# Patient Record
Sex: Female | Born: 1975 | Hispanic: No | Marital: Married | Smoking: Never smoker
Health system: Southern US, Community
[De-identification: ages and names within clinical notes are randomized; demographics above are authoritative.]

## PROBLEM LIST (undated history)

## (undated) ENCOUNTER — Emergency Department (HOSPITAL_COMMUNITY): Payer: No Typology Code available for payment source

## (undated) DIAGNOSIS — E119 Type 2 diabetes mellitus without complications: Secondary | ICD-10-CM

## (undated) DIAGNOSIS — E785 Hyperlipidemia, unspecified: Secondary | ICD-10-CM

## (undated) DIAGNOSIS — I10 Essential (primary) hypertension: Secondary | ICD-10-CM

## (undated) HISTORY — DX: Essential (primary) hypertension: I10

## (undated) HISTORY — DX: Hyperlipidemia, unspecified: E78.5

---

## 2016-06-07 ENCOUNTER — Emergency Department (HOSPITAL_COMMUNITY)
Admission: EM | Admit: 2016-06-07 | Discharge: 2016-06-07 | Disposition: A | Discharge status: Home / Self Care | Payer: Self-pay | Attending: Emergency Medicine | Admitting: Emergency Medicine

## 2016-06-07 ENCOUNTER — Encounter (HOSPITAL_COMMUNITY): Payer: Self-pay | Admitting: Emergency Medicine

## 2016-06-07 ENCOUNTER — Emergency Department (HOSPITAL_COMMUNITY): Payer: Self-pay

## 2016-06-07 DIAGNOSIS — E119 Type 2 diabetes mellitus without complications: Secondary | ICD-10-CM | POA: Insufficient documentation

## 2016-06-07 DIAGNOSIS — R109 Unspecified abdominal pain: Secondary | ICD-10-CM

## 2016-06-07 DIAGNOSIS — N898 Other specified noninflammatory disorders of vagina: Secondary | ICD-10-CM | POA: Insufficient documentation

## 2016-06-07 DIAGNOSIS — R102 Pelvic and perineal pain: Secondary | ICD-10-CM

## 2016-06-07 HISTORY — DX: Type 2 diabetes mellitus without complications: E11.9

## 2016-06-07 LAB — URINALYSIS, ROUTINE W REFLEX MICROSCOPIC
BACTERIA UA: NONE SEEN
Bilirubin Urine: NEGATIVE
Hgb urine dipstick: NEGATIVE
KETONES UR: NEGATIVE mg/dL
LEUKOCYTES UA: NEGATIVE
Nitrite: NEGATIVE
PROTEIN: NEGATIVE mg/dL
Specific Gravity, Urine: 1.013 (ref 1.005–1.030)
pH: 6 (ref 5.0–8.0)

## 2016-06-07 LAB — COMPREHENSIVE METABOLIC PANEL
ALK PHOS: 62 U/L (ref 38–126)
ALT: 18 U/L (ref 14–54)
AST: 17 U/L (ref 15–41)
Albumin: 4.3 g/dL (ref 3.5–5.0)
Anion gap: 10 (ref 5–15)
BUN: 6 mg/dL (ref 6–20)
CALCIUM: 9.7 mg/dL (ref 8.9–10.3)
CO2: 24 mmol/L (ref 22–32)
CREATININE: 0.54 mg/dL (ref 0.44–1.00)
Chloride: 100 mmol/L — ABNORMAL LOW (ref 101–111)
Glucose, Bld: 256 mg/dL — ABNORMAL HIGH (ref 65–99)
Potassium: 3.9 mmol/L (ref 3.5–5.1)
Sodium: 134 mmol/L — ABNORMAL LOW (ref 135–145)
Total Bilirubin: 0.9 mg/dL (ref 0.3–1.2)
Total Protein: 7.5 g/dL (ref 6.5–8.1)

## 2016-06-07 LAB — CBC WITH DIFFERENTIAL/PLATELET
Basophils Absolute: 0 10*3/uL (ref 0.0–0.1)
Basophils Relative: 0 %
EOS PCT: 0 %
Eosinophils Absolute: 0 10*3/uL (ref 0.0–0.7)
HCT: 43.8 % (ref 36.0–46.0)
Hemoglobin: 14.6 g/dL (ref 12.0–15.0)
Lymphocytes Relative: 16 %
Lymphs Abs: 2 10*3/uL (ref 0.7–4.0)
MCH: 23.4 pg — AB (ref 26.0–34.0)
MCHC: 33.3 g/dL (ref 30.0–36.0)
MCV: 70.3 fL — AB (ref 78.0–100.0)
MONO ABS: 0.2 10*3/uL (ref 0.1–1.0)
Monocytes Relative: 2 %
NEUTROS ABS: 10 10*3/uL — AB (ref 1.7–7.7)
NEUTROS PCT: 82 %
PLATELETS: 392 10*3/uL (ref 150–400)
RBC: 6.23 MIL/uL — ABNORMAL HIGH (ref 3.87–5.11)
RDW: 13.7 % (ref 11.5–15.5)
WBC: 12.2 10*3/uL — ABNORMAL HIGH (ref 4.0–10.5)

## 2016-06-07 LAB — WET PREP, GENITAL
CLUE CELLS WET PREP: NONE SEEN
Sperm: NONE SEEN
TRICH WET PREP: NONE SEEN
YEAST WET PREP: NONE SEEN

## 2016-06-07 LAB — POC URINE PREG, ED: Preg Test, Ur: NEGATIVE

## 2016-06-07 NOTE — ED Notes (Signed)
Pelvic cart at bedside. 

## 2016-06-07 NOTE — ED Notes (Signed)
Patient transported to Ultrasound 

## 2016-06-07 NOTE — ED Notes (Signed)
Patient verbalized understanding of discharge instructions and denies any further needs or questions at this time. VS stable. Patient ambulatory with steady gait.  

## 2016-06-07 NOTE — ED Provider Notes (Signed)
[facility]-EMERGENCY DEPT Provider Note   CSN: [id number] Arrival date & time: 06/07/16  1612     History   Chief Complaint Chief Complaint  Patient presents with  . Vaginal Discharge    HPI Morgan Osborne is a 41 y.o. female.  The history is provided by the patient. The history is limited by a language barrier. Language interpreter used: pt's cousin help translate; pt declined translator services.  Vaginal Discharge   This is a recurrent problem. Episode onset: several years. Episode frequency: intermittent. The problem has not changed since onset.Context: since IUD placement. The discharge was white and yellow. She has not missed her period. Associated symptoms include abdominal pain (suprapubic). Pertinent negatives include no anorexia, no fever, no constipation, no diarrhea, no nausea, no vomiting, no dyspareunia, no dysuria, no frequency, no genital burning, no genital itching, no genital lesions, no perineal pain and no perineal odor. Associated symptoms comments: Intermittent sharp vaginal pain that radiates to back. She has tried nothing for the symptoms. Her past medical history does not include irregular periods, STD or ectopic pregnancy.   She reports that IUP was placed 5 years ago.  Past Medical History:  Diagnosis Date  . Diabetes mellitus without complication (HCC)     There are no active problems to display for this patient.   History reviewed. No pertinent surgical history.  OB History    No data available       Home Medications    Prior to Admission medications   Not on File    Family History History reviewed. No pertinent family history.  Social History Social History  Substance Use Topics  . Smoking status: Never Smoker  . Smokeless tobacco: Never Used  . Alcohol use No     Allergies   Patient has no known allergies.   Review of Systems Review of Systems  Constitutional: Negative for fever.  Gastrointestinal: Positive for abdominal pain  (suprapubic). Negative for anorexia, constipation, diarrhea, nausea and vomiting.  Genitourinary: Positive for vaginal discharge. Negative for dyspareunia, dysuria and frequency.  Ten systems are reviewed and are negative for acute change except as noted in the HPI    Physical Exam Updated Vital Signs BP 126/74   Pulse 75   Temp 98.4 F (36.9 C) (Oral)   Resp 18   SpO2 99%   Physical Exam  Constitutional: She is oriented to person, place, and time. She appears well-developed and well-nourished. No distress.  HENT:  Head: Normocephalic and atraumatic.  Nose: Nose normal.  Eyes: Conjunctivae and EOM are normal. Pupils are equal, round, and reactive to light. Right eye exhibits no discharge. Left eye exhibits no discharge. No scleral icterus.  Neck: Normal range of motion. Neck supple.  Cardiovascular: Normal rate and regular rhythm.  Exam reveals no gallop and no friction rub.   No murmur heard. Pulmonary/Chest: Effort normal and breath sounds normal. No stridor. No respiratory distress. She has no rales.  Abdominal: Soft. She exhibits no distension. There is tenderness (discomfort) in the suprapubic area.  Genitourinary: Pelvic exam was performed with patient supine. There is no rash, tenderness or lesion on the right labia. There is no rash, tenderness or lesion on the left labia. Uterus is tender. Cervix exhibits no motion tenderness, no discharge and no friability. Right adnexum displays tenderness. Right adnexum displays no mass. Left adnexum displays no mass and no tenderness. No erythema in the vagina. No foreign body in the vagina. No vaginal discharge found.  Genitourinary Comments: Chaperone present during  pelvic exam. IUP string in OS   Musculoskeletal: She exhibits no edema or tenderness.  Neurological: She is alert and oriented to person, place, and time.  Skin: Skin is warm and dry. No rash noted. She is not diaphoretic. No erythema.  Psychiatric: She has a normal mood and  affect.  Vitals reviewed.    ED Treatments / Results  Labs (all labs ordered are listed, but only abnormal results are displayed) Labs Reviewed  WET PREP, GENITAL - Abnormal; Notable for the following:       Result Value   WBC, Wet Prep HPF POC MODERATE (*)    All other components within normal limits  URINALYSIS, ROUTINE W REFLEX MICROSCOPIC - Abnormal; Notable for the following:    Color, Urine STRAW (*)    Glucose, UA >=500 (*)    Squamous Epithelial / LPF 0-5 (*)    All other components within normal limits  CBC WITH DIFFERENTIAL/PLATELET - Abnormal; Notable for the following:    WBC 12.2 (*)    RBC 6.23 (*)    MCV 70.3 (*)    MCH 23.4 (*)    Neutro Abs 10.0 (*)    All other components within normal limits  COMPREHENSIVE METABOLIC PANEL - Abnormal; Notable for the following:    Sodium 134 (*)    Chloride 100 (*)    Glucose, Bld 256 (*)    All other components within normal limits  POC URINE PREG, ED  GC/CHLAMYDIA PROBE AMP ([place]) NOT AT [facility]    EKG  EKG Interpretation None       Radiology US Transvaginal Non-ob  Result Date: 06/07/2016 CLINICAL DATA:  Acute onset of pelvic pain and chronic vaginal discharge. Initial encounter. EXAM: TRANSABDOMINAL AND TRANSVAGINAL ULTRASOUND OF PELVIS TECHNIQUE: Both transabdominal and transvaginal ultrasound examinations of the pelvis were performed. Transabdominal technique was performed for global imaging of the pelvis including uterus, ovaries, adnexal regions, and pelvic cul-de-sac. It was necessary to proceed with endovaginal exam following the transabdominal exam to visualize the uterus and ovaries in greater detail. COMPARISON:  None FINDINGS: Uterus Measurements: 9.8 x 5.4 x 7.1 cm. No fibroids or other mass visualized. Endometrium Thickness: 1.0 cm. The patient's intrauterine device is noted in expected position at the fundus of the uterus. Right ovary Measurements: 3.6 x 2.5 x 2.8 cm. Normal appearance/no adnexal  mass. Left ovary Not visualized on this study. Other findings No abnormal free fluid. IMPRESSION: 1. Unremarkable pelvic ultrasound. Intrauterine device noted in expected position. 2. Left ovary not visualized.  No evidence for ovarian torsion. Electronically Signed   By: Roanna Raider M.D.   On: 06/07/2016 23:06   US Pelvis Complete  Result Date: 06/07/2016 CLINICAL DATA:  Acute onset of pelvic pain and chronic vaginal discharge. Initial encounter. EXAM: TRANSABDOMINAL AND TRANSVAGINAL ULTRASOUND OF PELVIS TECHNIQUE: Both transabdominal and transvaginal ultrasound examinations of the pelvis were performed. Transabdominal technique was performed for global imaging of the pelvis including uterus, ovaries, adnexal regions, and pelvic cul-de-sac. It was necessary to proceed with endovaginal exam following the transabdominal exam to visualize the uterus and ovaries in greater detail. COMPARISON:  None FINDINGS: Uterus Measurements: 9.8 x 5.4 x 7.1 cm. No fibroids or other mass visualized. Endometrium Thickness: 1.0 cm. The patient's intrauterine device is noted in expected position at the fundus of the uterus. Right ovary Measurements: 3.6 x 2.5 x 2.8 cm. Normal appearance/no adnexal mass. Left ovary Not visualized on this study. Other findings No abnormal free fluid. IMPRESSION: 1.  Unremarkable pelvic ultrasound. Intrauterine device noted in expected position. 2. Left ovary not visualized.  No evidence for ovarian torsion. Electronically Signed   By: Roanna RaiderJeffery  Chang M.D.   On: 06/07/2016 23:06    Procedures Procedures (including critical care time)  Medications Ordered in ED Medications - No data to display   Initial Impression / Assessment and Plan / ED Course  I have reviewed the triage vital signs and the nursing notes.  Pertinent labs & imaging results that were available during my care of the patient were reviewed by me and considered in my medical decision making (see chart for details).      No evidence of vaginal discharge on exam. Patient had no discomfort in the pelvic region. No CMT on exam. Low suspicion for torsion or TOA. CBC with mild leukocytosis. Other labs grossly reassuring. Wet prep without evidence of Trichomonas, bacterial vaginosis or yeast infection. We'll obtain pelvic ultrasound to assess for IUP location.  Pelvic ultrasound unremarkable.  Low suspicion for appendicitis, colitis, diverticulitis. The patient is safe for discharge with strict return precautions.   Final Clinical Impressions(s) / ED Diagnoses   Final diagnoses:  Vaginal discharge  Abdominal cramping   Disposition: Discharge  Condition: Good  I have discussed the results, Dx and Tx plan with the patient who expressed understanding and agree(s) with the plan. Discharge instructions discussed at great length. The patient was given strict return precautions who verbalized understanding of the instructions. No further questions at time of discharge.    There are no discharge medications for this patient.   Follow Up: primary care provider  Schedule an appointment as soon as possible for a visit    [facility] [address] [place] [id number] [phone] Schedule an appointment as soon as possible for a visit         Nira ConnPedro Eduardo Cardama, MD 06/08/16 [phone]

## 2016-06-07 NOTE — ED Triage Notes (Signed)
Pt sts vaginal discharge and pain; pt sts discharge is yellow in color

## 2016-06-08 LAB — GC/CHLAMYDIA PROBE AMP (~~LOC~~) NOT AT ARMC
Chlamydia: NEGATIVE
Neisseria Gonorrhea: NEGATIVE

## 2017-07-09 IMAGING — US US PELVIS COMPLETE
1 series · 14 of 25 positions shown · non-contrast
Comparison: None

CLINICAL DATA: Acute onset of pelvic pain and chronic vaginal
discharge. Initial encounter.

EXAM:
TRANSABDOMINAL AND TRANSVAGINAL ULTRASOUND OF PELVIS
TECHNIQUE: Both transabdominal and transvaginal ultrasound examinations of the
pelvis were performed. Transabdominal technique was performed for
global imaging of the pelvis including uterus, ovaries, adnexal
regions, and pelvic cul-de-sac. It was necessary to proceed with
endovaginal exam following the transabdominal exam to visualize the
uterus and ovaries in greater detail.

[Series 1: us pelvis complete · 0.22mm/px · 90 acquisitions, 14 frames shown]
[im 1/90]
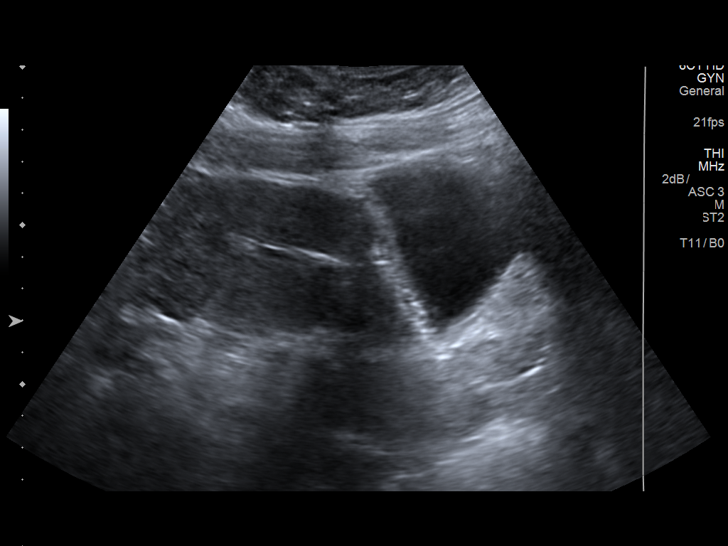
[im 8/90]
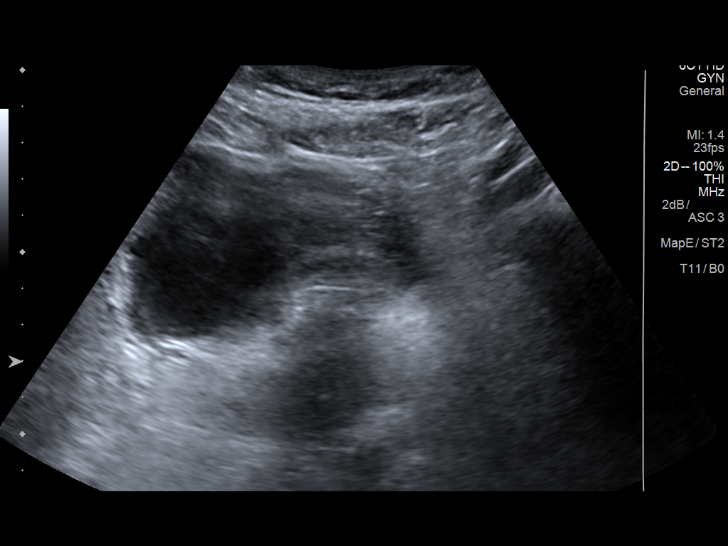
[im 15/90]
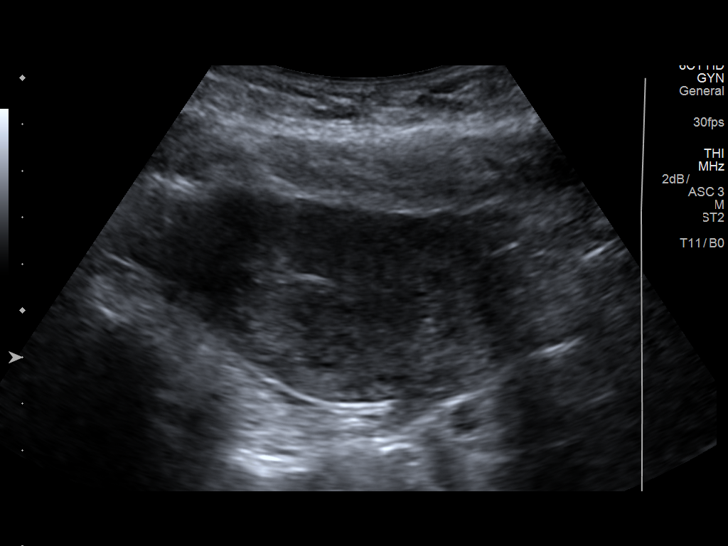
[im 23/90]
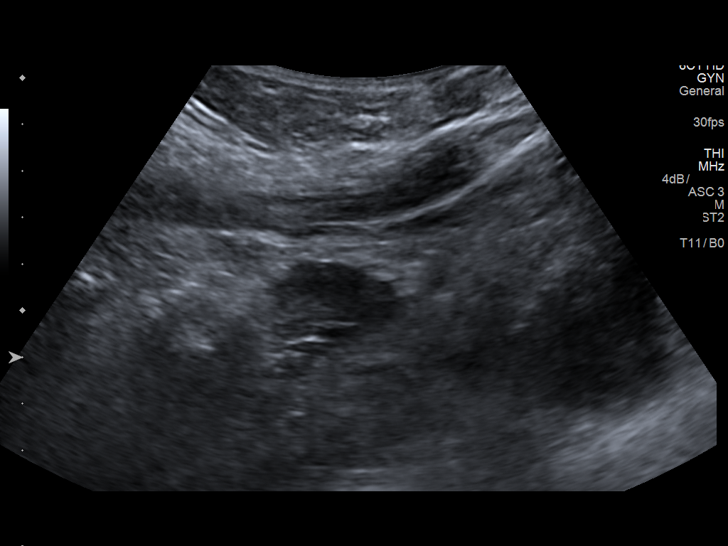
[im 30/90]
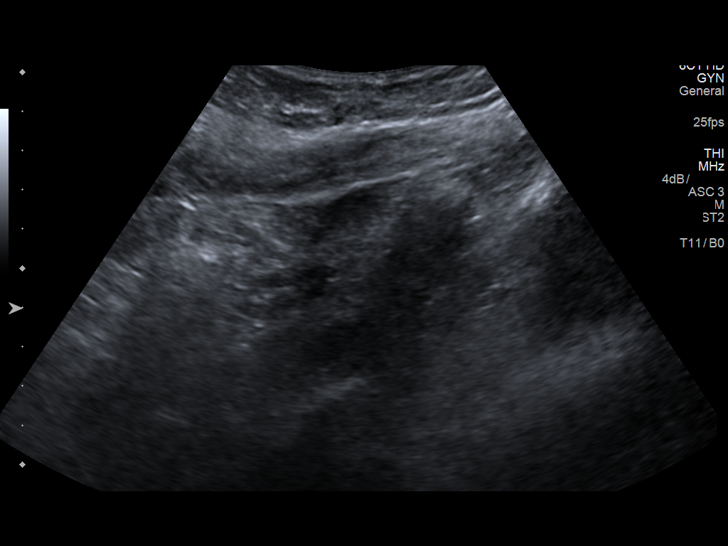
[im 34/90]
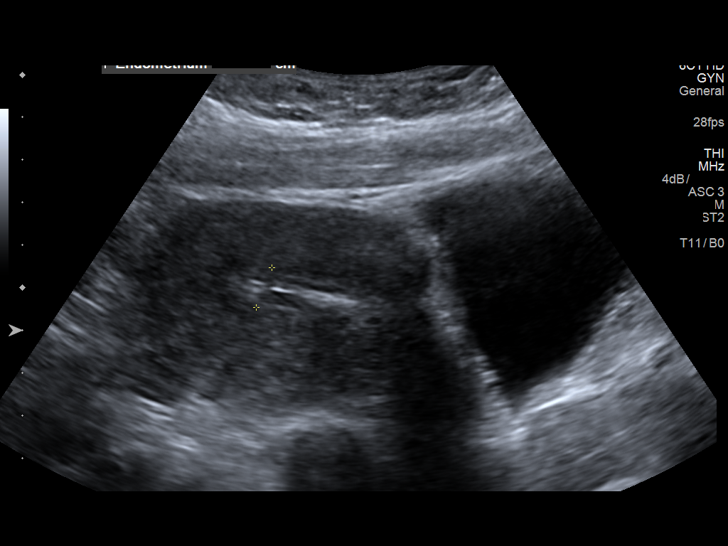
[im 41/90]
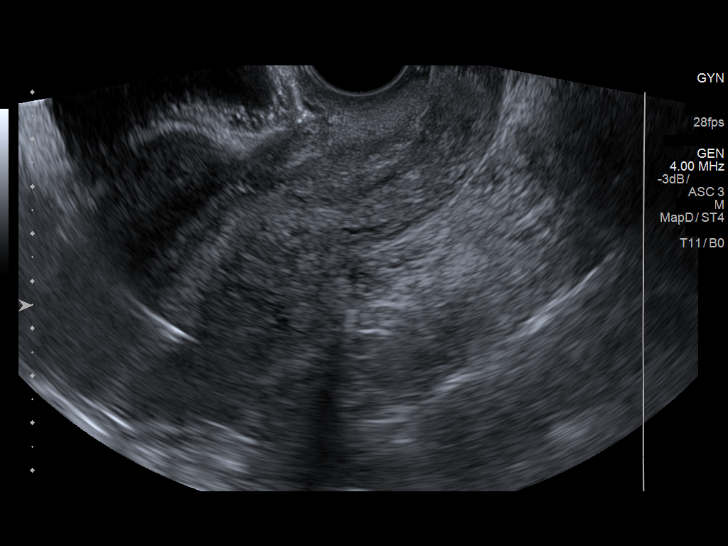
[im 49/90]
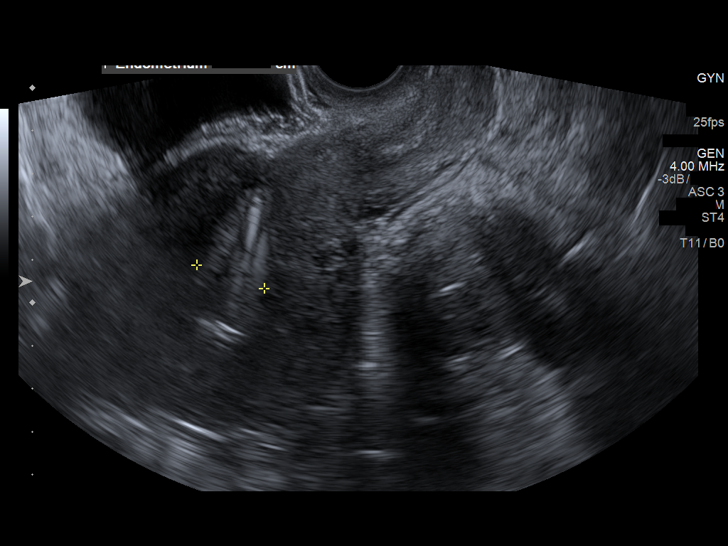
[im 56/90]
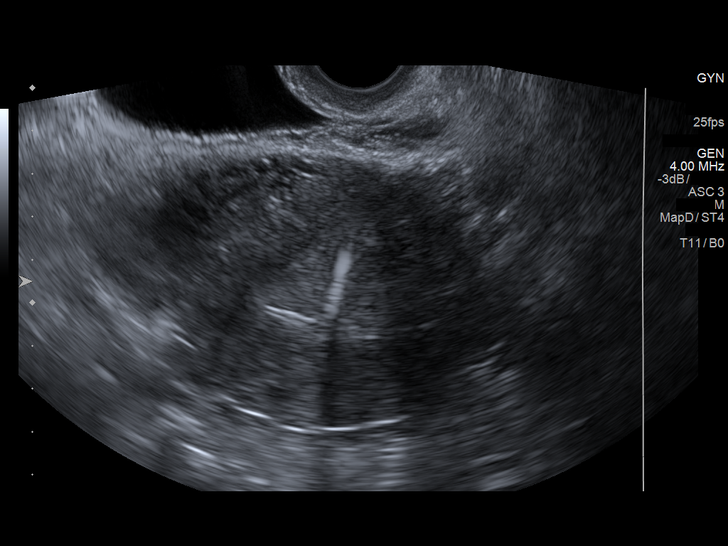
[im 60/90]
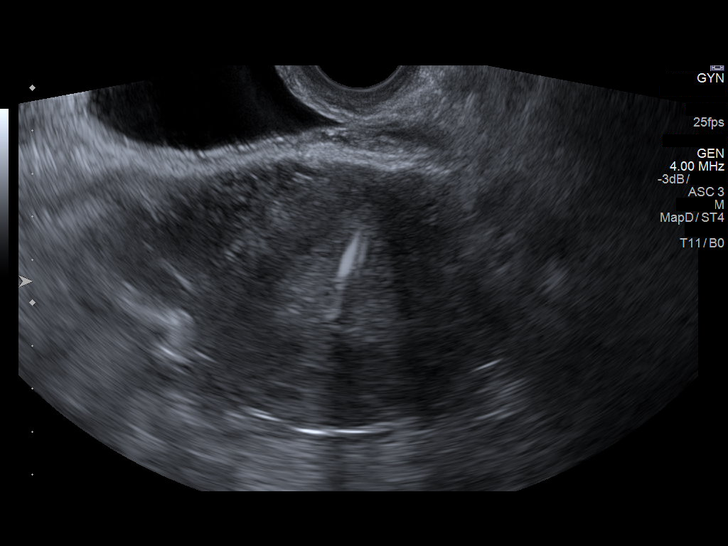
[im 67/90]
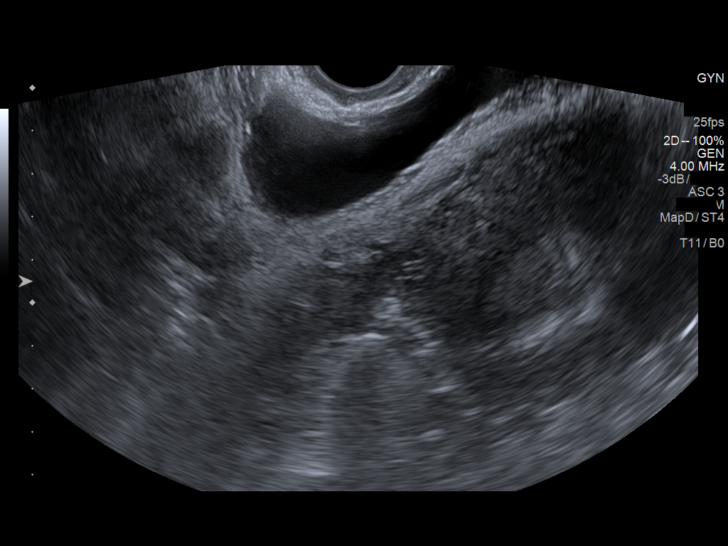
[im 75/90]
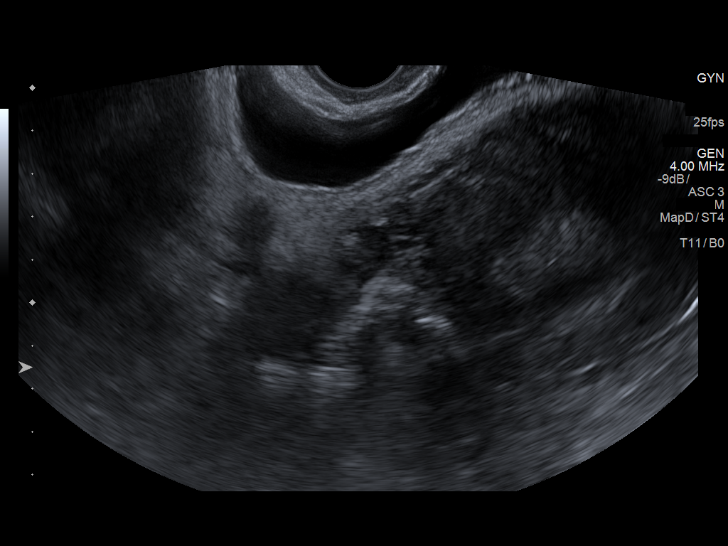
[im 82/90]
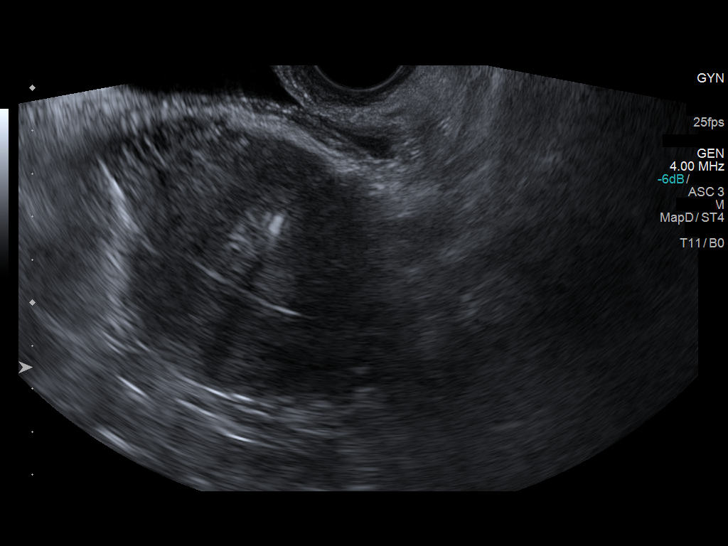
[im 90/90]
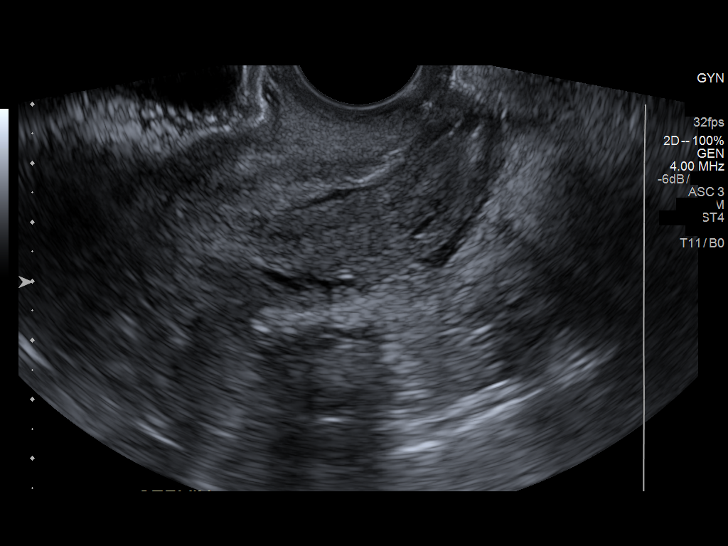

[14 of 25 positions shown; findings below may reference images not displayed]

FINDINGS: Uterus

Measurements: 9.8 x 5.4 x 7.1 cm. No fibroids or other mass
visualized.

Endometrium

Thickness: 1.0 cm. The patient's intrauterine device is noted in
expected position at the fundus of the uterus.

Right ovary

Measurements: 3.6 x 2.5 x 2.8 cm. Normal appearance/no adnexal mass.

Left ovary

Not visualized on this study.

Other findings

No abnormal free fluid.
IMPRESSION: 1. Unremarkable pelvic ultrasound. Intrauterine device noted in
expected position.
2. Left ovary not visualized.  No evidence for ovarian torsion.

## 2019-08-08 ENCOUNTER — Ambulatory Visit: Payer: Self-pay | Attending: Internal Medicine

## 2019-08-08 DIAGNOSIS — Z23 Encounter for immunization: Secondary | ICD-10-CM

## 2019-08-08 NOTE — Progress Notes (Signed)
   Covid-19 Vaccination Clinic  Name:  Coco Sharpnack    MRN: [id number] DOB: 27-Sep-1975  08/08/2019  Ms. Leung was observed post Covid-19 immunization for 15 minutes without incident. She was provided with Vaccine Information Sheet and instruction to access the V-Safe system.   Ms. Irigoyen was instructed to call 911 with any severe reactions post vaccine: Marland Kitchen Difficulty breathing  . Swelling of face and throat  . A fast heartbeat  . A bad rash all over body  . Dizziness and weakness   Immunizations Administered    Name Date Dose VIS Date Route   Pfizer COVID-19 Vaccine 08/08/2019  1:27 PM 0.3 mL 04/19/2019 Intramuscular   Manufacturer: [employer], [employer]   Lot: [id number]   NDC: [address]-6

## 2019-09-02 ENCOUNTER — Ambulatory Visit: Payer: Self-pay | Attending: Internal Medicine

## 2019-09-02 DIAGNOSIS — Z23 Encounter for immunization: Secondary | ICD-10-CM

## 2019-09-02 NOTE — Progress Notes (Signed)
   Covid-19 Vaccination Clinic  Name:  [name]    MRN: [id number] DOB: 03/06/1976  09/02/2019  Ms. Goyal was observed post Covid-19 immunization for 15 minutes without incident. She was provided with Vaccine Information Sheet and instruction to access the V-Safe system.   Ms. Rua was instructed to call 911 with any severe reactions post vaccine: Marland Kitchen Difficulty breathing  . Swelling of face and throat  . A fast heartbeat  . A bad rash all over body  . Dizziness and weakness   Immunizations Administered    Name Date Dose VIS Date Route   Pfizer COVID-19 Vaccine 09/02/2019  1:07 PM 0.3 mL 07/03/2018 Intramuscular   Manufacturer: [employer], [employer]   Lot: [id number]   NDC: [address]-1      Covid-19 Vaccination Clinic  Name:  [name]    MRN: [id number] DOB: 02-17-1976  09/02/2019  Ms. Barron was observed post Covid-19 immunization for 15 minutes without incident. She was provided with Vaccine Information Sheet and instruction to access the V-Safe system.   Ms. Krauss was instructed to call 911 with any severe reactions post vaccine: Marland Kitchen Difficulty breathing  . Swelling of face and throat  . A fast heartbeat  . A bad rash all over body  . Dizziness and weakness   Immunizations Administered    Name Date Dose VIS Date Route   Pfizer COVID-19 Vaccine 09/02/2019  1:07 PM 0.3 mL 07/03/2018 Intramuscular   Manufacturer: [employer], [employer]   Lot: [id number]   NDC: [address]-0

## 2019-11-25 ENCOUNTER — Other Ambulatory Visit: Payer: Self-pay

## 2019-11-25 ENCOUNTER — Ambulatory Visit (INDEPENDENT_AMBULATORY_CARE_PROVIDER_SITE_OTHER): Payer: PRIVATE HEALTH INSURANCE | Admitting: Family Medicine

## 2019-11-25 ENCOUNTER — Encounter: Payer: Self-pay | Admitting: Family Medicine

## 2019-11-25 VITALS — BP 112/78 | HR 90 | Ht 61.81 in | Wt 191.6 lb

## 2019-11-25 DIAGNOSIS — Z113 Encounter for screening for infections with a predominantly sexual mode of transmission: Secondary | ICD-10-CM | POA: Insufficient documentation

## 2019-11-25 DIAGNOSIS — Z114 Encounter for screening for human immunodeficiency virus [HIV]: Secondary | ICD-10-CM | POA: Diagnosis not present

## 2019-11-25 DIAGNOSIS — E119 Type 2 diabetes mellitus without complications: Secondary | ICD-10-CM | POA: Diagnosis not present

## 2019-11-25 DIAGNOSIS — Z1159 Encounter for screening for other viral diseases: Secondary | ICD-10-CM

## 2019-11-25 DIAGNOSIS — Z13220 Encounter for screening for lipoid disorders: Secondary | ICD-10-CM | POA: Insufficient documentation

## 2019-11-25 DIAGNOSIS — Z3009 Encounter for other general counseling and advice on contraception: Secondary | ICD-10-CM

## 2019-11-25 LAB — POCT GLYCOSYLATED HEMOGLOBIN (HGB A1C): HbA1c, POC (controlled diabetic range): 11.5 % — AB (ref 0.0–7.0)

## 2019-11-25 NOTE — Patient Instructions (Signed)
°  R?t vui ???c g?p b?n hm nay! B?n ?ang lm r?t t?t. hm nay chng ti ? ki?m tra d?u hi?u ti?u ???ng v cc xt nghi?m t?ng qut khc trong phng th nghi?m. Ti s? g?i cho b?n v?i k?t qu? n?u chng l m tnh. Chng ti ? s?p x?p m?t cu?c h?n cho nexplanon vo ngy 5 thng 8 lc 10 gi? sng.  Chng ti s? ??t v ??t vng trnh thai vo ngy 6 thng 8 lc 11 gi? sng.  mong ???c g?p b?n sau ?Marland Kitchen  Ti?n s? Royetta Crochet

## 2019-11-25 NOTE — Progress Notes (Signed)
    SUBJECTIVE:   CHIEF COMPLAINT / HPI:   Morgan Osborne is a 44 yr old female who presents today for a new patient visit. Vietnamese interpretor was used for this visit.  Diabetes Managed by previous PCP. Takes Metformin, Glimerpiride and Lantus for diabetes. Denies side effects from medications or hypoglycemic symptoms. Has not ever seen an eye doctor for diabetes eye exam. Denies previous foot exam.   PMH: Type 2 Diabetes  HTN   PSH: C-section x 2  DH: NKDA Nexplanon-inserted in 2018 and due for removal 04/15/2020 Metformin Lantus Glimerpiride Rosuvastatin   FH: Daugther has cardiac defect and is awaiting heart transplant  SH: Moved from [place] 4 years ago. Has 4 children and also grandchildren. 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 Denies smoking, EOTH or recreational drugs.   PERTINENT  PMH / PSH: as above  OBJECTIVE:   BP 112/78   Pulse 90   Ht 5' 1.81" (1.57 m)   Wt 191 lb 9.6 oz (86.9 kg)   SpO2 98%   BMI 35.26 kg/m    General: Alert, pleasant, no acute distress Cardio: Normal S1 and S2, RRR  Pulm: CTAB, Normal respiratory effort Abdomen: Bowel sounds normal. Abdomen soft and non-tender.  Extremities: No peripheral edema. Warm/ well perfused.  Strong radial  pulse Neuro: Cranial nerves grossly intact  ASSESSMENT/PLAN:   Screening examination for STD (sexually transmitted disease) HIV, RPR, hep b and hep c labs taken today.  Diabetes mellitus without complication (HCC) No recent A1c on file. Checked A1c which is 11. Will contact patient of result and consider starting GLP 1 agonist/SGLT2 inhibitor if BMP normal and discontinue Glimepiride.  Screening cholesterol level Lipid panel obtained today. Continue rosuvastatin.  Birth control counseling Patient nexplanon needs to be removed next month on 12/19/19. She would like IUD inserted as she has tried this before and she prefers it. Nexplanon removal scheduled for 12/12/19 and IUD  insertion scheduled for 12/13/19.     Towanda Octave, MD [facility]

## 2019-11-25 NOTE — Assessment & Plan Note (Addendum)
Lipid panel obtained today. Continue rosuvastatin.

## 2019-11-25 NOTE — Assessment & Plan Note (Addendum)
HIV, RPR, hep b and hep c labs taken today.

## 2019-11-25 NOTE — Assessment & Plan Note (Signed)
Patient nexplanon needs to be removed next month on 12/19/19. She would like IUD inserted as she has tried this before and she prefers it. Nexplanon removal scheduled for 12/12/19 and IUD insertion scheduled for 12/13/19.

## 2019-11-25 NOTE — Assessment & Plan Note (Signed)
No recent A1c on file. Checked A1c which is 11. Will contact patient of result and consider starting GLP 1 agonist/SGLT2 inhibitor if BMP normal and discontinue Glimepiride.

## 2019-11-26 LAB — LIPID PANEL
Chol/HDL Ratio: 3.8 ratio (ref 0.0–4.4)
Cholesterol, Total: 144 mg/dL (ref 100–199)
HDL: 38 mg/dL — ABNORMAL LOW (ref >39)
LDL Chol Calc (NIH): 78 mg/dL (ref 0–99)
Triglycerides: 165 mg/dL — ABNORMAL HIGH (ref 0–149)
VLDL Cholesterol Cal: 28 mg/dL (ref 5–40)

## 2019-11-26 LAB — RPR: RPR Ser Ql: NONREACTIVE

## 2019-11-26 LAB — BASIC METABOLIC PANEL
BUN/Creatinine Ratio: 15 (ref 9–23)
BUN: 10 mg/dL (ref 6–24)
CO2: 23 mmol/L (ref 20–29)
Calcium: 9.9 mg/dL (ref 8.7–10.2)
Chloride: 96 mmol/L (ref 96–106)
Creatinine, Ser: 0.66 mg/dL (ref 0.57–1.00)
GFR calc Af Amer: 123 mL/min/{1.73_m2} (ref >59)
GFR calc non Af Amer: 106 mL/min/{1.73_m2} (ref >59)
Glucose: 279 mg/dL — ABNORMAL HIGH (ref 65–99)
Potassium: 4.6 mmol/L (ref 3.5–5.2)
Sodium: 133 mmol/L — ABNORMAL LOW (ref 134–144)

## 2019-11-26 LAB — HEPATITIS C ANTIBODY: Hep C Virus Ab: <0.1 s/co ratio (ref 0.0–0.9)

## 2019-11-26 LAB — HEPATITIS B SURFACE ANTIBODY, QUANTITATIVE: Hepatitis B Surf Ab Quant: 56.9 m[IU]/mL (ref >9.9)

## 2019-11-27 ENCOUNTER — Other Ambulatory Visit: Payer: Self-pay | Admitting: Family Medicine

## 2019-11-27 ENCOUNTER — Telehealth: Payer: Self-pay | Admitting: Family Medicine

## 2019-11-27 NOTE — Telephone Encounter (Signed)
Called patient's mobile number to inform her of her diabetes results and the need to start another medication however no answer and no VM available. Therefore called patient's niece who usually helps make her appointments. I scheduled her an appointment with Dr Nobie Putnam on 28th July to discuss diabetes management and start GLP agonist/SGLT inhibitor for uncontrolled diabetes.

## 2019-11-27 NOTE — Telephone Encounter (Signed)
Called niece as directed

## 2019-12-04 ENCOUNTER — Ambulatory Visit (INDEPENDENT_AMBULATORY_CARE_PROVIDER_SITE_OTHER): Payer: PRIVATE HEALTH INSURANCE | Admitting: Family Medicine

## 2019-12-04 ENCOUNTER — Other Ambulatory Visit: Payer: Self-pay

## 2019-12-04 ENCOUNTER — Encounter: Payer: Self-pay | Admitting: Family Medicine

## 2019-12-04 DIAGNOSIS — E119 Type 2 diabetes mellitus without complications: Secondary | ICD-10-CM | POA: Diagnosis not present

## 2019-12-04 MED ORDER — OZEMPIC (0.25 OR 0.5 MG/DOSE) 2 MG/1.5ML ~~LOC~~ SOPN: 0.5000 mg | PEN_INJECTOR | SUBCUTANEOUS | 2 refills | Status: DC

## 2019-12-04 NOTE — Progress Notes (Signed)
    SUBJECTIVE:   CHIEF COMPLAINT / HPI:   Diabetes follow-up Patient's most recent hemoglobin A1c was 11.5 on 11/25/2019.  Patient reports good compliance with her glimepiride and Metformin as well as her Lantus 10 units daily.  Reports that she is under a lot of stress at this time with a child who was hospitalized and intubated as well as taking care of her 46 year old mother so she has not been checking her blood sugars daily.  Denies any signs or symptoms of hyperglycemia or hypoglycemia.  Acknowledges that there was discussion at her previous visit regarding transitioning to another diabetic medication and she is interested in this.  OBJECTIVE:   BP 112/76   Pulse 98   Ht 5' 1.81" (1.57 m)   Wt 189 lb 6.4 oz (85.9 kg)   SpO2 98%   BMI 34.85 kg/m   General: Physically well-appearing 44 year old female Cardiac: Regular rate and rhythm, no murmurs appreciated Respiratory: Normal work of breathing, lungs clear to auscultation bilaterally Abdomen: Soft, nontender, positive bowel sounds Psych: Patient is intermittently tearful when discussing her child and all of the stress that she is under.  Sadness and and level of stress seem appropriate given current situation.  ASSESSMENT/PLAN:   Diabetes mellitus without complication (HCC) Hemoglobin A1c was 11.4 at last visit.  Patient was here to discuss transition to GLP-1 agonist/SLG 2 inhibitor.  Discussed case with pharmacy team.  Patient reports she will have difficulty paying for expensive diabetes medication at this time.  Pharmacy has provided samples of Ozempic for the patient to use.  We have sent a prescription for Ozempic and will fill out the prior authorization when received. -Discontinue glimepiride -Continue Metformin 1000 mg twice daily -Initiate Ozempic weekly -Follow-up on blood sugars as well as how the patient is doing at next visit on 8/6   Plan on starting Ozempic  Derrel Nip, MD [facility]

## 2019-12-04 NOTE — Assessment & Plan Note (Signed)
Hemoglobin A1c was 11.4 at last visit.  Patient was here to discuss transition to GLP-1 agonist/SLG 2 inhibitor.  Discussed case with pharmacy team.  Patient reports she will have difficulty paying for expensive diabetes medication at this time.  Pharmacy has provided samples of Ozempic for the patient to use.  We have sent a prescription for Ozempic and will fill out the prior authorization when received. -Discontinue glimepiride -Continue Metformin 1000 mg twice daily -Initiate Ozempic weekly -Follow-up on blood sugars as well as how the patient is doing at next visit on 8/6

## 2019-12-04 NOTE — Patient Instructions (Addendum)
It was a pleasure to meet you today.  I am sorry you are having all of the stressors in your life and hope you have child gets better!  Today for your diabetes we have discussed her options and you were going to stop taking the glimepiride.  We are going to start you on a medication called Ozempic.  We are giving you samples for now and have sent a prescription to your pharmacy.  It will require a prior authorization which our office will deal with to help pay for the medication.  If you have any questions or concerns please feel free to call our clinic.  I hope you have a wonderful afternoon!  R?t vui ???c g?p b?n hm nay. Ti xin l?i b?n ?ang g?p ph?i t?t c? nh?ng c?ng th?ng trong cu?c s?ng c?a b?n v hy v?ng b?n c con s? t?t h?n! Hm nay ??i v?i b?nh ti?u ???ng c?a b?n, chng ti ? th?o lu?n v? cc l?a ch?n c?a c ?y v b?n s? ng?ng dng glimepiride. Chng ti s? b?t ??u cho b?n m?t lo?i thu?c c tn l Ozempic. Chng ti hi?n ?ang ??a cho b?n cc m?u v ? g?i ??n thu?c ??n hi?u thu?c c?a b?n. N s? yu c?u s? cho php tr??c m v?n phng c?a chng ti s? gi?i quy?t ?? gip tr? ti?n thu?c. N?u b?n c b?t k? cu h?i ho?c th?c m?c no vui lng g?i ??n phng khm c?a chng ti. Ti hy v?ng b?n c m?t bu?i chi?u tuy?t v?i!   Semaglutide injection solution ?y l thu?c g? SEMAGLUTIDE ???c dng ?? c?i thi?n s? ki?m sot ???ng huy?t ? ng??i l?n b? b?nh ti?u ???ng type 2. Thu?c ny c th? ???c dng chung v?i cc thu?c tr? b?nh ti?u ???ng khc. Thu?c ny c?ng c th? lm gi?m nguy c? b? nh?i mu c? tim ho?c ??t qu?, n?u qu v? b? ti?u t??ng type 2 v c cc y?u t? nguy c? c?a b?nh tim. Thu?c ny c th? ???c dng cho nh?ng m?c ?ch khc; hy h?i ng??i cung c?p d?ch v? y t? ho?c d??c s? c?a mnh, n?u qu v? c th?c m?c. (CC) NHN HI?U PH? BI?N: OZEMPIC Ti c?n ph?i bo cho ng??i cung c?p d?ch v? y t? c?a mnh ?i?u g tr??c khi dng thu?c ny? H? c?n bi?t li?u qu v? c b?t k? tnh tr?ng no sau ?y  khng:  b? cc b??u n?i ti?t (MEN 2) ho?c c ai ? trong gia ?nh qu v? b? nh?ng u b??u ny  b?nh v? m?t, cc v?n ?? v? th? l?c  ti?n s? vim t?y  b?nh th?n  cc v?n ?? v? bao t?  b? ung th? tuy?n gip tr?ng ho?c c ai ? trong gia ?nh qu v? b? ung th? tuy?n gip tr?ng  c pha?n ??ng b?t th???ng ho??c di? ??ng v??i semaglutide  pha?n ??ng b?t th???ng ho??c di? ??ng v??i ca?c d??c ph?m kha?c  pha?n ??ng b?t th???ng ho??c di? ??ng v??i th??c ph?m, thu?c nhu?m, ho??c ch?t ba?o qua?n  ?ang c thai ho??c ??nh co? thai  ?ang cho con bu? Ti nn s? d?ng thu?c ny nh? th? no? Thu?c ny ???c dng ?? tim d??i da ? chn pha trn (?i), vng b?ng, ho?c pha trn cnh tay. Thu?c ???c tim m?i tu?n m?t l?n (7 ngy m?t l?n). Qu v? s? ???c by cch chu?n b? v cch cho thu?c ny. Hy s? d?ng ?ng nh? ? ???c  ch? d?n. Dng thu?c ny vo nh?ng kho?ng th?i gian ??u nhau. Khng ???c dng thu?c ny nhi?u l?n h?n ? ???c ch? d?n. N?u dng thu?c ny cng v?i insulin, th qu v? ph?i tim thu?c ny ring r? v?i insulin. Khng ???c pha tr?n chng v?i nhau. Khng ???c tim hai thu?c ny ngay c?nh nhau. Thay ??i (lun phin) ch? tim cho m?i l?n tim. ?i?u quan tr?ng l qu v? ph?i b? kim tim v ?ng tim ? dng vo thng chuyn ??ng v?t bn nh?n. Khng ???c b? chng vo thng rc thng th??ng. N?u qu v? khng c thng chuyn ??ng v?t bn nh?n, th hy g?i t?i d??c s? ho?c [place] vin y t? c?a mnh ?? xin m?t ci. D??c s? s? ??a cho qu v? m?t B?n H??ng D?n v? D??c Ph?m (MedGuide) ??c bi?t cho m?i toa thu?c v cho m?i l?n mua thm thu?c ?. Hy b?o ??m ??c k? thng tin ny m?i l?n. Thu?c ny ???c km theo H??NG D?N S? D?NG. Hy h?i d??c s? c?a qu v? ?? ???c h??ng d?n v? cch s? d?ng thu?c ny. ??c k? thng tin. Hy bn v?i d??c s? ho?c [place] vin y t? c?a mnh, n?u qu v? c th?c m?c. Hy bn v?i bc s? nhi khoa c?a qu v? v? vi?c dng thu?c ny ? tr? em. C th? c?n ch?m [place] ??c bi?t. Qu  li?u: N?u qu v? cho r?ng mnh ? dng qu nhi?u thu?c ny, th hy lin l?c v?i trung tm ki?m sot ch?t ??c ho?c phng c?p c?u ngay l?p t?c. L?U : Thu?c ny ch? dnh ring cho qu v?. Khng chia s? thu?c ny v?i nh?ng ng??i khc. N?u ti l? qun m?t li?u th sao? N?u qu v? l? qun m?t li?u thu?c, th hy dng li?u thu?c b? qun ? cng s?m cng t?t, n?i trong 5 ngy sau li?u thu?c ? b? qun. Sau ?, hy dng li?u thu?c k? ti?p vo th?i gian th??ng l? hng tu?n c?a mnh. N?u ? qu 5 ngy sau li?u thu?c b? qun, th khng ???c dng li?u thu?c ? b? l? qun ?. Hy dng li?u thu?c k? ti?p nh? th??ng l?Imagene Sheller. Khng ???c dng li?u g?p ?i ho?c dng thm li?u. Hy lin l?c v?i bc s? ho?c [place] vin y t? c?a mnh, n?u qu v? khng bi?t ch?c ph?i lm g v?i li?u thu?c ? b? l? qun. Nh?ng g c th? t??ng tc v?i thu?c ny?  cc thu?c khc dng ?? tr? b?nh ti?u ???ng Nhi?u thu?c c th? lm thay ??i m?c ???ng huy?t, nh?ng thu?c ny bao g?m:  ?? u?ng c r??u  cc thu?c dng ?? tr? nhi?m HIV ho?c AIDS  aspirin v cc thu?c gi?ng aspirin  cc thu?c dng ?? tr? huy?t p cao, b?nh tim ho?c nh?p tim khng ??u  chromium  cc thu?c l?i ti?u  cc n?i ti?t t? n?, ch?ng h?n nh? estrogens ho?c progestins, v vin thu?c ng?a thai  fenofibrate  gemfibrozil  isoniazid  lanreotide  cc n?i ti?t t? nam ho?c cc thu?c steroid ??ng ha  cc d??c ph?m g?i l ch?t ?c ch? men monoamine oxidase (MAO), ch?ng h?n nh? [place], [place], Marplan, Eldepryl, Carbex  cc thu?c gip gi?m cn  cc thu?c dng cho d? ?ng, hen suy?n, c?m l?nh, ho?c ho  cc thu?c dng cho ch?ng tr?m c?m, tnh tr?ng lo u, ho?c cc r?i nhi?u tm th?n  niacin  nicotine  Cc thu?c khng vim khng ph?i  steroid (Non steroidal anti-inflamation drug - NSAID), cc thu?c gi?m ?au v khng vim, ch?ng h?n nh? ibuprofen, naproxen  octreotide  pasireotide  pentamidine  phenytoin  probenecid  cc thu?c khng sinh nhm quinolone,  ch?ng h?n nh? ciprofloxacin, levofloxacin, ofloxacin  m?t s? ch? ph?m b? sung thu?c ti?t th?c th?o d??c  cc thu?c steroid, ch?ng h?n nh? prednisone ho?c cortisone  trimethoprim-sulfamethoxazole  cc n?i ti?t t? tuy?n gip tr?ng M?t s? thu?c c th? che l?p cc tri?u ch?ng c?nh bo v? m?c ???ng huy?t th?p. Qu v? c th? c?n ph?i theo di m?c ???ng huy?t c?a mnh ch?t ch? h?n, n?u qu v? ?ang dng m?t trong s? nh?ng thu?c ny. Nh?ng thu?c ny bao g?m:  cc thu?c ch?n beta, th??ng ???c dng cho cc v?n ?? v? huy?t p ho?c tim (v d? nh? atenolol, metoprolol, propranolol)  clonidine  guanethidine  reserpine Danh sch ny c th? khng m t? ?? h?t cc t??ng tc c th? x?y ra. Hy ??a cho ng??i cung c?p d?ch v? y t? c?a mnh danh sch t?t c? cc thu?c, th?o d??c, cc thu?c khng c?n toa, ho?c cc ch? ph?m b? sung m qu v? dng. C?ng nn bo cho h? bi?t r?ng qu v? c ht thu?c, u?ng r??u, ho?c c s? d?ng ma ty tri php hay khng. Vi th? c th? t??ng tc v?i thu?c c?a qu v?. Ti c?n ph?i theo di ?i?u g trong khi dng thu?c ny? Hy ?i g?p bc s? ho?c [place] vin y t? ?? theo di ??nh k? s? c?i thi?n c?a qu v?. Hy u?ng nhi?u ch?t l?ng trong khi ?ang dng thu?c ny. Hy lin l?c v?i bc s? ho?c chuyn vin y t? c?a mnh, n?u qu v? c c?n tiu ch?y tr?m tr?ng, b? bu?n i v i. Tnh tr?ng m?t qu nhi?u d?ch c? th? c th? gy nguy hi?m khi qu v? dng thu?c ny. M?t xt nghi?m g?i l HbA1C (A1C) s? ???c gim st. ?y l m?t xt nghi?m mu ??n gi?n. N l??ng ??nh s? ki?m sot m?c ???ng huy?t c?a qu v? trong vng 2 ??n 3 thng qua. Qu v? s? ???c lm xt nghi?m ny m?i 3 ??n 6 thng. H?c cch ki?m tra ???ng huy?t c?a mnh. Tm hi?u v? cc tri?u ch?ng ???ng huy?t th?p v cao, v cch ki?m sot chng. Lun lun mang theo ngu?n cung c?p ???ng c?p t?c ?? phng khi qu v? c tri?u ch?ng h? ???ng huy?t. V d? nh? k?o c?ng ho?c cc vin glucose. Hy ch?c ch?n r?ng nh?ng ng??i khc bi?t l qu v? c  th? b? ngh?n n?u qu v? ?n ho?c u?ng khi c cc tri?u ch?ng h? ???ng huy?t n?ng, ch?ng h?n nh? co gi?t ho?c b?t t?nh. H? ph?i tm h? tr? y t? ngay l?p t?c. Hy bo cho bc s? ho?c [place] vin y t? c?a mnh, n?u qu v? c m?c ???ng huy?t cao. Qu v? c th? c?n ph?i thay ??i li?u dng c?a thu?c ny. N?u qu v? b? b?nh ho?c luy?n t?p nhi?u h?n bnh th??ng, th qu v? c th? c?n thay ??i li?u dng c?a thu?c ny. Khng ???c b? qua cc b?a ?n. Hy h?i bc s? ho?c chuyn vin y t? c?a mnh ?? xem qu v? c nn trnh r??u hay khng. Nhi?u ch? ph?m ch?a ho v c?m l?nh lo?i khng c?n toa c ch?a ???ng ho?c r??u. Nh?ng th? ny c th? ?nh h??ng ??n ???ng huy?t. ??ng bao gi?  xi chung ch? cy bt tim. Ngay c? khi ? thay kim tim, vi?c xi chung c th? lm ly truy?n virus, ch?ng h?n nh? virus vim gan ho?c HIV. Hy ?eo vng tay ho?c dy chuy?n c th? ghi ch thng tin y t? c nhn (medical identification). Hy mang theo th? ghi thng tin y t? c nhn (medical identification) c ghi ch cc chi ti?t v? tnh tr?ng c?a qu v?, cc tn thu?c, v tn bc s? c?a qu v?. Khng ???c ?? c [place] trong khi dng thu?c ny. Ph? n? c?n ph?i thng bo cho bc s? c?a mnh, n?u mu?n c thai ho?c ngh? r?ng c th? mnh ? c [place]. C nguy c? v? cc tc d?ng ph? nghim tr?ng ??i v?i [place] nhi. Hy th?o lu?n v?i bc s? ho?c chuyn vin y t? ho?c d??c s? ?? bi?t thm thng tin. Ti c th? nh?n th?y nh?ng tc d?ng ph? no khi dng thu?c ny? Nh?ng tc d?ng ph? qu v? c?n ph?i bo cho bc s? ho?c chuyn vin y t? cng s?m cng t?t:  cc ph?n ?ng d? ?ng, ch?ng h?n nh? da b? m?n ??, ng?a, n?i my ?ay, s?ng ? m?t, mi, ho?c l??i  kh th?  thay ??i th? l?c  b? tiu ch?y lin t?c ho?c tr?m tr?ng  u cu?c ho??c s?ng ?? c?  b? bu?n i d? d?i  cc d?u hi?u v tri?u ch?ng nhi?m trng, ch?ng h?n nh? s?t ho?c ?n l?nh; ho; ?au h?ng; kh ?i ti?u ho?c ?i ti?u ?au  cc d?u hi?u v tri?u ch?ng h? ???ng huy?t nh? l c?m th?y lo l?ng, l l?n,  chng m?t, ?i h?n, m?t m?i ho?c y?u ?t khc th??ng, v m? hi, run, l?nh, b?t r?t, ?au ??u, nhn m?, tim ??p nhanh, m?t  th?c  cc d?u hi?u v tri?u ch?ng t?n th??ng th?n, nh? kh ?i ti?u ho?c thay ??i l??ng n??c ti?u  kh nu?t  kh ch?u ho?c ?au bao t? b?t th??ng  i m?a Cc tc d?ng ph? khng c?n ph?i ch?m [place] y t? (hy bo cho bc s? ho?c chuyn vin y t?, n?u cc tc d?ng ph? ny ti?p di?n ho?c gy phi?n toi):  to bn  tiu ch?y  bu?n i  ?au, ??, ng?a, ho?c kch ?ng ? ch? tim  kh ch?u ? bao t? Danh sch ny c th? khng m t? ?? h?t cc tc d?ng ph? c th? x?y ra. Xin g?i t?i bc s? c?a mnh ?? ???c c? v?n chuyn mn v? cc tc d?ng ph?Ladell Heads v? c th? t??ng trnh cc tc d?ng ph? cho FDA theo s? [phone]. Ti nn c?t gi? thu?c c?a mnh ? ?u? ?? ngoi t?m tay tr? em. C?t gi? cc cy bt tim ch?a khui trong t? l?nh t? 2 ??n 8 ?? C (34 ??n 44 ?? F). Khng ???c ?? ?ng l?nh. Trnh nhi?t v nh sng. Sau khi dng cy bt tim l?n ??u tin, qu v? c th? gi? n trong 56 ngy ? nhi?t ?? phng t? 15 ??n 30 ?? C (57 ??n 84 ?? F) ho?c c?t trong t? l?nh. V?t b? cy bt tim thu?c ? dng sau 54 ngy ho?c sau ngy h?t h?n, ty theo th?i ?i?m no ??n tr??c. Khng ???c c?t gi? cy bt tim thu?c ?ang cn g?n kim tim. N?u v?n ?? nguyn kim tim, th thu?c c th? r? ra kh?i cy bt tim thu?c. L?U : ?y l b?n tm  t?t. N c th? khng bao hm t?t c? thng tin c th? c. N?u qu v? th?c m?c v? thu?c ny, xin trao ??i v?i bc s?, d??c s?, ho?c ng??i cung c?p d?ch v? y t? c?a mnh.  2020 Elsevier/Gold Standard (2019-02-27 00:00:00)

## 2019-12-04 NOTE — Progress Notes (Signed)
Asked by Dr. Nobie Putnam to consult on diabetes medications for patient. Initiated Ozempic 0.25 mg once weekly (on Sundays). Patient educated on purpose, proper use and potential adverse effects of nausea.   Following instruction, patient demonstrated understanding of administration technique and conveyed understanding of treatment plan through translator.   Education provided by Filbert Schilder, PharmD Candidate.

## 2019-12-12 ENCOUNTER — Ambulatory Visit (INDEPENDENT_AMBULATORY_CARE_PROVIDER_SITE_OTHER): Payer: PRIVATE HEALTH INSURANCE | Admitting: Family Medicine

## 2019-12-12 ENCOUNTER — Other Ambulatory Visit: Payer: Self-pay

## 2019-12-12 VITALS — BP 114/78 | HR 83 | Ht 61.81 in | Wt 188.8 lb

## 2019-12-12 DIAGNOSIS — Z3046 Encounter for surveillance of implantable subdermal contraceptive: Secondary | ICD-10-CM

## 2019-12-12 DIAGNOSIS — R21 Rash and other nonspecific skin eruption: Secondary | ICD-10-CM

## 2019-12-12 MED ORDER — CETIRIZINE HCL 10 MG PO TABS: 10.0000 mg | ORAL_TABLET | Freq: Every day | ORAL | 11 refills | Status: DC

## 2019-12-12 NOTE — Patient Instructions (Addendum)
It was a pleasure to see you today!  1. For the nexplanon removal: keep the bandage on tonight and remove tomorrow morning, wash with soap and water.  - You may use tylenol for pain - It is normal to have some redness and swelling immediately around the wound, but if this redness spreads, or you notice pustulant drainage, call our office and return for care.  2. For your rash: take 1 pill daily to help with itching, you may also use a strong emollient, like vaseline for dryness and to help with itching.  3. We will schedule you in our dermatology clinic for a biopsy of the rash.  4. Come back tomorrow for your IUD insertion.  5. If there is anything we can do to support you during this difficult time, please don't hesitate to ask.  Be Well,  Dr. Leary Roca  R?t vui ???c g?p b?n hm nay!  1. ?? lo?i b? nexplanon: gi? b?ng vo ?m nay v tho ra vo sng mai, r?a b?ng x phng v n??c. - B?n c th? s? d?ng tylenol ?? gi?m ?au - Bnh th??ng xung quanh v?t th??ng c m?t cht ?? v s?ng t?y, nh?ng n?u v?t ?? ny lan r?ng ho?c b?n nh?n th?y c m? ch?y ra, hy g?i cho v?n phng c?a chng ti v quay l?i ?? ???c ch?m [place].  2. ??i v?i pht ban: u?ng 1 vin m?i ngy ?? gi?m ng?a, b?n c?ng c th? s? d?ng ch?t lm m?m da m?nh, nh? vaseline ?? lm kh v gi?m ng?a.  3. Chng ti s? h?n b?n ??n phng khm da li?u c?a chng ti ?? lm sinh thi?t pht ban.  4. Hy quay l?i vo ngy mai ?? ??t vng trnh [place] c?a b?n.  5. N?u c b?t c? ?i?u g chng ti c th? lm ?? h? tr? b?n trong th?i gian kh kh?n ny, xin ??ng ng?n ng?i h?i.  M?nh gi?i nh,  Ti?n s? [place]

## 2019-12-12 NOTE — Progress Notes (Signed)
PROCEDURE NOTE: NEXPLANON  REMOVAL Patient given informed consent and signed copy in the chart. LEFT arm area prepped and draped in the usual sterile fashion. Three cc of lidocaine without epinephrine 1% used for local anesthesia. A small stab incision was made close to the nexplanon with scalpel. Hemostats were used to withdraw the nexplanon. A small bandage was applied over a steri strip  No complications.Patient given follow up instructions should she experience redness, swelling at sight or fever in the next 24 hours. Patient was reminded this totally removes her nexplanon contraceptive devise. (she can now potentially conceive) Patient has appointment for IUD placement tomorrow.

## 2019-12-13 ENCOUNTER — Ambulatory Visit: Payer: PRIVATE HEALTH INSURANCE | Admitting: Family Medicine

## 2019-12-13 ENCOUNTER — Ambulatory Visit (INDEPENDENT_AMBULATORY_CARE_PROVIDER_SITE_OTHER): Payer: PRIVATE HEALTH INSURANCE | Admitting: Family Medicine

## 2019-12-13 ENCOUNTER — Encounter: Payer: Self-pay | Admitting: Family Medicine

## 2019-12-13 VITALS — BP 112/78 | HR 82 | Ht 61.8 in | Wt 188.0 lb

## 2019-12-13 DIAGNOSIS — Z3042 Encounter for surveillance of injectable contraceptive: Secondary | ICD-10-CM

## 2019-12-13 DIAGNOSIS — Z30018 Encounter for initial prescription of other contraceptives: Secondary | ICD-10-CM | POA: Diagnosis not present

## 2019-12-13 DIAGNOSIS — Z30013 Encounter for initial prescription of injectable contraceptive: Secondary | ICD-10-CM | POA: Diagnosis not present

## 2019-12-13 LAB — POCT URINE PREGNANCY: Preg Test, Ur: NEGATIVE

## 2019-12-13 MED ORDER — MEDROXYPROGESTERONE ACETATE 150 MG/ML IM SUSY
150.0000 mg | PREFILLED_SYRINGE | Freq: Once | INTRAMUSCULAR | Status: AC
Start: 2019-12-13 — End: 2019-12-13
  Administered 2019-12-13: 150 mg via INTRAMUSCULAR

## 2019-12-13 NOTE — Patient Instructions (Signed)
Medroxyprogesterone injection [Contraceptive] ?y l thu?c g? Thu?c chch ng?a thai MEDROXYPROGESTERONE phng ng?a [place] nghn. Chng c kh? n?ng ng?a thai hi?u qu? trong 3 thng. Depo-subQ Provera 104 c?ng ???c dng ?? ?i?u tr? ch?ng ?au c lin quan ??n b?nh l?c n?i m?c t? cung. Thu?c ny c th? ???c dng cho nh?ng m?c ?ch khc; hy h?i ng??i cung c?p d?ch v? y t? ho?c d??c s? c?a mnh, n?u qu v? c th?c m?c. (CC) NHN HI?U PH? BI?N: Depo-Provera, Depo-subQ Provera 104 Ti c?n ph?i bo cho ng??i cung c?p d?ch v? y t? c?a mnh ?i?u g tr??c khi dng thu?c ny? H? c?n bi?t li?u qu v? hi?n c b?t k? tnh tr?ng no sau ?y hay khng:  th??ng xuyn u?ng r??u  hen suy?n  b?nh m?ch mu ho?c ti?n s? c mu ?ng trong ph?i ho?c c?ng chn  b?nh v? x??ng ch?ng h?n nh? long x??ng  ung th? v  b?nh ti?u ???ng  r?i lo?n ?n u?ng (ch?ng chn ?n ho?c hu ?n)  huy?t a?p cao  nhi?m HIV ho?c AIDS  b?nh th?n  b?nh gan  tr?m c?m  ch?ng ?au n?a ??u  co gi?t (kinh phong)  ??t qu?  ng??i hu?t thu?c la?  xu?t huy?t m ??o  pha?n ??ng b?t th???ng ho??c di? ??ng v??i medroxyprogesterone  pha?n ??ng b?t th???ng ho??c di? ??ng v??i cc n?i ti?t t? ho?c cc d??c ph?m khc  pha?n ??ng b?t th???ng ho??c di? ??ng v??i th??c ph?m, thu?c nhu?m, ho??c ch?t ba?o qua?n  ?ang c thai ho??c ??nh co? thai  ?ang cho con bu? Ti nn s? d?ng thu?c ny nh? th? no? Thu?c tim Ng?a [place] Depo-Provera ???c tim vo b?p th?t. Thu?c tim Depo-subQ Provera 104 ???c tim d??i da. M?i tim s? ???c chuyn vin y t? tim cho qu v?. Qu v? ph?i khng c thai tr??c khi tim thu?c. M?i tim th??ng ???c th?c hi?n trong 5 ngy ??u c?a k? kinh nguy?t ho?c 6 tu?n sau khi sinh con. Hy bn v?i bc s? nhi khoa c?a qu v? v? vi?c dng thu?c ny ? tr? em. C th? c?n ch?m [place] ??c bi?t. Thu?c ny ? ???c dng ? nh?ng b gi ? b?t ??u c kinh nguy?t. Qu li?u: N?u qu v? cho r?ng mnh ? dng qu nhi?u thu?c  ny, th hy lin l?c v?i trung tm ki?m sot ch?t ??c ho?c phng c?p c?u ngay l?p t?c. L?U : Thu?c ny ch? dnh ring cho qu v?. Khng chia s? thu?c ny v?i nh?ng ng??i khc. N?u ti l? qun m?t li?u th sao? C? g?ng ??ng b? l? m?t li?u thu?c no. Qu v? ph?i tim thu?c 3 thng m?t l?n ?? duy tr kh? n?ng ng?a [place]. N?u qu v? khng th? ??n m?t cu?c h?n khm, hy g?i t?i v ??t h?n l?i. N?u qu v? ch? nhi?u h?n 13 tu?n gi?a cc m?i tim ng?a [place] Depo-Provera ho?c nhi?u h?n 14 tu?n gi?a cc m?i tim Depo-subQ Provera 104, qu v? c th? c [place] (b? dnh b?u). Hy dng m?t bi?n php ng?a thai khc n?u qu v? b? l? m?t cu?c h?n khm. Qu v? c?ng c th? c?n lm th? nghi?m thai nghn tr??c khi tim m?t m?i khc. Nh?ng g c th? t??ng tc v?i thu?c ny? Khng ???c dng thu?c ny cng v?i b?t k? th? no sau ?y:  bosentan Thu?c ny c?ng c th? t??ng tc v?i cc thu?c sau ?y:  aminoglutethimide  cc thu?c  khng sinh ho?c thu?c tr? cc b?nh nhi?m trng, ??c bi?t l rifampin, rifabutin, rifapentine, v griseofulvin  aprepitant  cc thu?c nhm barbiturate, ch?ng h?n nh? phenobarbital ho?c primidone  bexarotene  carbamazepine  m?t s? thu?c dng cho cc ch?ng co gi?t, ch?ng h?n nh? ethotoin, felbamate, oxcarbazepine, phenytoin, topiramate  modafinil  cy [facility] (c? [facility] [facility]/cy n?c s?i/cy l?nh) Danh sch ny c th? khng m t? ?? h?t cc t??ng tc c th? x?y ra. Hy ??a cho ng??i cung c?p d?ch v? y t? c?a mnh danh sch t?t c? cc thu?c, th?o d??c, cc thu?c khng c?n toa, ho?c cc ch? ph?m b? sung m qu v? dng. C?ng nn bo cho h? bi?t r?ng qu v? c ht thu?c, u?ng r??u, ho?c c s? d?ng ma ty tri php hay khng. Vi th? c th? t??ng tc v?i thu?c c?a qu v?. Ti c?n ph?i theo di ?i?u g trong khi dng thu?c ny? Thu?c ny khng b?o v? cho qu v? kh?i b? nhi?m HIV ho?c AIDS ho?c cc b?nh ly qua ???ng tnh d?c khc. S? d?ng s?n ph?m ny c th? gy ra hao t?n calci t? x??ng. S?  hao t?n calci c th? gy y?u x??ng (ch?ng long x??ng). Ch? s? d?ng s?n ph?m ny qu 2 n?m, n?u cc hnh th?c ng?a thai khc khng thch h?p v?i qu v?. Dng ch? ph?m ng?a thai ny cng lu, th qu v? cng d? c nguy c? b? y?u x??ng. Hy h?i bc s? ho?c chuyn vin y t? c?a qu v? lm sao ?? gi? cho x??ng ch?c kh?e. Qu v? c th? c thay ??i trong ki?u hnh kinh ho?c c k? kinh khng ??u. Nhi?u ph? n? ng?ng th?y kinh trong khi dng thu?c ny. N?u qu v? ? ???c tim thu?c ?ng lc, th kh? n?ng c thai r?t th?p. N?u qu v? ngh? r?ng mnh c th? c [place], th hy ??n g?p bc s? ho?c chuyn vin y t? cng s?m cng t?t. Hy bo cho bc s? ho?c chuyn vin y t?, n?u qu v? mu?n c thai n?i trong n?m t?i. Tc d?ng c?a thu?c ny c th? t?n t?i trong m?t th?i gian di sau m?i tim cu?i cng. Ti c th? nh?n th?y nh?ng tc d?ng ph? no khi dng thu?c ny? Nh?ng tc d?ng ph? qu v? c?n ph?i bo cho bc s? ho?c chuyn vin y t? cng s?m cng t?t:  cc ph?n ?ng d? ?ng, ch?ng h?n nh? da b? m?n ??, ng?a, n?i my ?ay, s?ng ? m?t, mi, ho?c l??i   ?m ? v ho?c xu?t ti?t  cc v?n ?? v? h h?p  thay ??i th? l?c  tr?m c?m  c?m th?y chong vng, ng?t x?u, b? t  s?t  ?au ? b?ng, ng?c, hng, ho?c chn  cc v?n ?? v? gi? th?ng b?ng, ni n?ng, ?i l?i  m?t m?i ho?c y?u ?t b?t th??ng  vng da ho?c m?t Cc tc d?ng ph? khng c?n ph?i ch?m [place] y t? (hy bo cho bc s? ho?c chuyn vin y t?, n?u cc tc d?ng ph? ny ti?p di?n ho?c gy phi?n toi):  m?n tr?ng c  b? gi? n??c v s?ng  ?au ??u  k? kinh khng ??u, xu?t huy?t r? r? t?ng ??t, ho?c khng th?y kinh  ?au, ng?a, kch ?ng da nh?t th?i ? ch? tim  t?ng cn [facility] ny c th? khng m t? ?? h?t cc tc d?ng ph? c th? x?y  raGray Bernhardt g?i t?i bc s? c?a mnh ?? ???c c? v?n chuyn mn v? cc tc d?ng ph?Ladell Heads v? c th? t??ng trnh cc tc d?ng ph? cho FDA theo s? [phone]. Ti nn c?t gi? thu?c c?a mnh ? ?u? ?i?u ny khng p d?ng. M?i tim  s? ???c chuyn vin y t? tim cho qu v?. L?U : ?y l b?n tm t?t. N c th? khng bao hm t?t c? thng tin c th? c. N?u qu v? th?c m?c v? thu?c ny, xin trao ??i v?i bc s?, d??c s?, ho?c ng??i cung c?p d?ch v? y t? c?a mnh.  2020 [employer]vier/Gold Standard (2016-05-26 00:00:00)

## 2019-12-13 NOTE — Progress Notes (Signed)
u

## 2019-12-17 DIAGNOSIS — Z30013 Encounter for initial prescription of injectable contraceptive: Secondary | ICD-10-CM | POA: Insufficient documentation

## 2019-12-17 NOTE — Assessment & Plan Note (Signed)
Unable to sound uterus due to stenotic cervix and therefore unable to insert IUD. Pt chose depo today instead. Upreg negative. Counseled pt on side effects of depo. Pt would like to consider tube ligation in the future. We can discuss this further at the next clinic visit before next depo is due.

## 2019-12-17 NOTE — Progress Notes (Addendum)
    SUBJECTIVE:   CHIEF COMPLAINT / HPI:   Morgan Osborne isw a 44 yr old female who present today for IUD insertion  A [place] interpretor was used for this visit.  IUD insertion Pt had nexplanon removed yesterday. At previous visit we discussed different form of birth control and she opted for IUD for birth control. Sexually active with her husband. Denies other sexual partners. LMP unknown. Consented for IUD procedure today.  PERTINENT  PMH / PSH: DM OBJECTIVE:   BP 112/78   Pulse 82   Ht 5' 1.8" (1.57 m)   Wt 188 lb (85.3 kg)   SpO2 98%   BMI 34.61 kg/m    General: Alert, no acute distress, pleasant  Pulm: Normal respiratory effort Abdomen: Bowel sounds normal. Abdomen soft and non-tender.  Neuro: Cranial nerves grossly intact  Pelvic exam chaperoned by CMA Jessica. Unfortunately unable to sound uterus due to stenotic cervix, Dr Deirdre Priest also tried. Aborted IUD procedure.  ASSESSMENT/PLAN:   Initiation of Depo Provera Unable to sound uterus due to stenotic cervix and therefore unable to insert IUD. Pt chose depo today instead. Upreg negative. Counseled pt on side effects of depo. Pt would like to consider tube ligation in the future. We can discuss this further at the next clinic visit before next depo is due.     Towanda Octave, MD [facility]

## 2019-12-27 ENCOUNTER — Telehealth: Payer: Self-pay | Admitting: *Deleted

## 2019-12-27 NOTE — Telephone Encounter (Signed)
Received fax from pharmacy, PA needed on ozempic.  Clinical questions submitted via Cover My Meds.  Waiting on response, could take up to 72 hours.  Cover My Meds info: Key: [id number]  Jone Baseman, CMA

## 2019-12-30 NOTE — Telephone Encounter (Signed)
Thank you. Do I need to fill out any paperwork?

## 2019-12-30 NOTE — Telephone Encounter (Signed)
Nope, just waiting for a response. Jone Baseman, CMA

## 2019-12-30 NOTE — Telephone Encounter (Signed)
Thank you for letting me know

## 2020-01-14 NOTE — Telephone Encounter (Signed)
    Med approved.  Pharmacy informed.  (Delayed documentation). Jone Baseman, CMA

## 2020-01-14 NOTE — Telephone Encounter (Signed)
Great thank you!

## 2020-01-15 ENCOUNTER — Other Ambulatory Visit: Payer: Self-pay

## 2020-01-15 MED ORDER — LOSARTAN POTASSIUM-HCTZ 50-12.5 MG PO TABS: 1.0000 | ORAL_TABLET | Freq: Every day | ORAL | 0 refills | Status: DC

## 2020-03-02 ENCOUNTER — Telehealth: Payer: Self-pay

## 2020-03-02 NOTE — Telephone Encounter (Signed)
Patients niece calls nurse line requesting pen needles for Ozempic to be sent to her pharmacy. Please advise.

## 2020-03-05 ENCOUNTER — Other Ambulatory Visit: Payer: Self-pay

## 2020-03-05 ENCOUNTER — Ambulatory Visit: Payer: PRIVATE HEALTH INSURANCE

## 2020-03-08 ENCOUNTER — Other Ambulatory Visit: Payer: Self-pay | Admitting: Family Medicine

## 2020-03-10 NOTE — Telephone Encounter (Signed)
Called her pharmacy and they said ozempic should come with needles.

## 2020-03-12 ENCOUNTER — Other Ambulatory Visit: Payer: Self-pay

## 2020-03-12 ENCOUNTER — Ambulatory Visit (INDEPENDENT_AMBULATORY_CARE_PROVIDER_SITE_OTHER): Payer: PRIVATE HEALTH INSURANCE | Admitting: Family Medicine

## 2020-03-12 VITALS — BP 110/68 | HR 82 | Wt 181.2 lb

## 2020-03-12 DIAGNOSIS — Z3046 Encounter for surveillance of implantable subdermal contraceptive: Secondary | ICD-10-CM

## 2020-03-12 DIAGNOSIS — Z308 Encounter for other contraceptive management: Secondary | ICD-10-CM | POA: Diagnosis not present

## 2020-03-12 DIAGNOSIS — Z30017 Encounter for initial prescription of implantable subdermal contraceptive: Secondary | ICD-10-CM

## 2020-03-12 LAB — POCT URINE PREGNANCY: Preg Test, Ur: NEGATIVE

## 2020-03-12 NOTE — Patient Instructions (Signed)
Ms. Fahs,  It was a pleasure taking care of you today! We inserted a Nexplanon implant into your left arm. This will help prevent pregnancy and we hope will also cause your menses to be lighter. Please keep the bandage on your arm for 24 hours to prevent bleeding. Your arm may be sore for a few days. You are welcome to use Tylenol or ibuprofen for pain. If you have any other questions or concerns, do not hesitate to call our office.  Dorothyann Gibbs and Dr. Hessie Knows,  R?t vui khi ???c ch?m [place] b?n hm nay! Chng ti ? c?y ghp Nexplanon vo cnh tay tri c?a b?n. ?i?u ny s? gip trnh [place] v chng ti hy v?ng c?ng s? khi?n kinh nguy?t c?a b?n nh? h?n. Vui lng gi? b?ng trn cnh tay c?a b?n trong 24 gi? ?? ng?n ch?y mu. Cnh tay c?a b?n c th? b? ?au trong vi ngy. B?n c th? s? d?ng Tylenol ho?c ibuprofen ?? gi?m ?au. N?u b?n c b?t k? cu h?i ho?c th?c m?c no khc, ??ng ng?n ng?i g?i cho v?n phng c?a chng ti.  Dorothyann Gibbs v Ti?n s? [place]

## 2020-03-12 NOTE — Progress Notes (Signed)
NEXPLANON INSERTION   PRE-OP DIAGNOSIS: desired long-term, reversible contraception   POST-OP DIAGNOSIS: Same   PROCEDURE: Nexplanon  placement  Performing Physician: Janit Pagan, MD, MPH  Supervising Physician (if applicable): N/A   PROCEDURE:   Site (check):       [_]      Right Arm        [X]      Left Arm          Serial/Lot #                         Expiration Date: 08/06/2022  Sterile Preparation:    [X]       Betadine        [_]     Chloraprep           [_]   Insertion site was selected 10 cm from medial epicondyle and marked along with guiding site using sterile marker. The bicipital grove was located and an insertion site was identified 3 inches below the grove.  Procedure area was prepped and draped in a sterile fashion. 5 mL of 1% lidocaine with epinephrine used for subcutaneous anesthesia. Anesthesia confirmed.   Nexplanon  trocar was inserted subcutaneously and then Nexplanon  capsule delivered subcutaneously. Trocar was removed from the insertion site. Nexplanon  capsule was palpated by provider and patient to assure satisfactory placement.  Estimated blood loss of 0  ML  Dressings applied:   Gauze/Tape     Followup: The patient tolerated the procedure well without complications.  Standard post-procedure care is explained and return precautions are given.

## 2020-03-13 MED ORDER — ETONOGESTREL 68 MG ~~LOC~~ IMPL
68.0000 mg | DRUG_IMPLANT | Freq: Once | SUBCUTANEOUS | Status: AC
  Administered 2020-03-12: 68 mg via SUBCUTANEOUS

## 2020-03-13 NOTE — Addendum Note (Signed)
Addended by: Veronda Prude on: 03/13/2020 11:21 AM   Modules accepted: Orders

## 2020-04-16 ENCOUNTER — Telehealth: Payer: Self-pay | Admitting: Family Medicine

## 2020-04-16 NOTE — Telephone Encounter (Signed)
Patient would like to have her losartan refilled.

## 2020-04-20 ENCOUNTER — Other Ambulatory Visit: Payer: Self-pay | Admitting: Family Medicine

## 2020-04-20 ENCOUNTER — Telehealth: Payer: Self-pay | Admitting: Family Medicine

## 2020-04-20 NOTE — Telephone Encounter (Signed)
Patient walked to request refill of: Metfromin  Name of Medication(s):  Metformin HCL 1000 MG Last date of OV:  03/12/20 Pharmacy:  Pt states pharmacy said they called her doctor but no answer.  Will route refill request to Clinic RN.  Discussed with patient policy to call pharmacy for future refills.  Also, discussed refills may take up to 48 hours to approve or deny.  Vilinda Blanks

## 2020-04-21 ENCOUNTER — Other Ambulatory Visit: Payer: Self-pay | Admitting: Family Medicine

## 2020-04-21 MED ORDER — LOSARTAN POTASSIUM-HCTZ 50-12.5 MG PO TABS: 1.0000 | ORAL_TABLET | Freq: Every day | ORAL | 2 refills | Status: DC

## 2020-04-21 NOTE — Telephone Encounter (Signed)
Refilled Losartan. Thank you.

## 2020-05-06 ENCOUNTER — Other Ambulatory Visit: Payer: Self-pay | Admitting: Family Medicine

## 2020-05-19 NOTE — Progress Notes (Addendum)
     SUBJECTIVE:   CHIEF COMPLAINT / HPI:   Morgan Osborne is a 45 y.o. female presents for vaginal pruritus    A vietnamese speaking interpretor was using for the entirety of this encounter.   Vaginal pruritus  Vaginal pruritus and soreness, dysuria started on 1 month. Denies vaginal discharge, vaginal odors. LMP 04/21. Minor spotting immediately after  Nexplanon. She denies hematuria, frequency, abdominal pain, pelvic pain, nausea, vomiting, fevers or new rash. Denies history of STIs. She is sexually active and does not condoms. Contraception: nexplanon inserted 03/12/2020   Diabetes Patient's current diabetic medications include lantus 10 units daily, meformin 1000mg  BID, ozempic 0.5mg  once a week. Tolerating well without side effects.  Patient endorses compliance with these medications. Admits to diet high in sugar and also reduced exercise recently. CBG readings averaging in the 160-180 range.  Patient's last A1c was: Lab Results  Component Value Date   HGBA1C 14.0 (A) 05/20/2020   HGBA1C 11.5 (A) 11/25/2019   A1c today is 14. Denies abdominal pain, blurred vision, polyuria, polydipsia, hypoglycemia.   Last Microalbumin, LDL, Creatinine: Lab Results  Component Value Date   LDLCALC 78 11/25/2019   CREATININE 0.66 11/25/2019    Health maintenance  Covid vaccine: completed  Flu vaccine: overdue  Colonoscopy: pending  Pap: never done   PERTINENT  PMH / PSH: DM, HLD   OBJECTIVE:   BP 108/74   Pulse 89   Ht 5' 1.8" (1.57 m)   Wt 177 lb 3.2 oz (80.4 kg)   SpO2 99%   BMI 32.62 kg/m    General: Alert, no acute distress Cardio: Normal S1 and S2, RRR, no r/m/g Pulm: CTAB, normal work of breathing Abdomen: Bowel sounds normal. Abdomen soft and non-tender. intertrigo noted in pannus fold  Extremities: No peripheral edema.  Neuro: Cranial nerves grossly intact    Pelvic Exam chaperoned by CMA April         External: erythematous vulva, no lesions        Vagina: normal  without lesions or masses        Cervix: normal without lesions or masses        Pap smear: performed        Wet prep swabs obtained   ASSESSMENT/PLAN:   Cervical cancer screening Obtained pap smear today.  Screening examination for STD (sexually transmitted disease) Wet prep obtained including gc/chamydia, candida and trich.  Vulvovaginitis Vulvovaginitis likely 2/2 to poorly controlled diabetes (A1c today 14). Wet prep obtained incl yeast (send out). On exam: erythematous vuvla and intertrigo of pannus fold. Treated with 1150mg  diflucan and nystatin cream.  Diabetes mellitus without complication (HCC) A1c 14 today. Pt is well appearing, denies sx of HSS/DKA. Patient endorses compliance with medications but has admits to a very poor diet full of sugar. Patient has a poor understanding of diabetes and long term complications which is probably due to the language barrier. I explained the importance of lifestyle modifications for her diabetes. Recommended: -ATC app 1/13 for further diabetes education and to reinforce management plan below -Increase Lantus 15 units tonight, consider increasing further tomorrow  -Increase Ozempic to 1mg  weekly -Continue Metformin 1000mg  BID -Close follow up in the next 1 month      , MD PGY-2 [facility]

## 2020-05-20 ENCOUNTER — Other Ambulatory Visit: Payer: Self-pay

## 2020-05-20 ENCOUNTER — Encounter: Payer: Self-pay | Admitting: Family Medicine

## 2020-05-20 ENCOUNTER — Ambulatory Visit (INDEPENDENT_AMBULATORY_CARE_PROVIDER_SITE_OTHER): Payer: No Typology Code available for payment source | Admitting: Family Medicine

## 2020-05-20 ENCOUNTER — Other Ambulatory Visit (HOSPITAL_COMMUNITY)
Admission: RE | Admit: 2020-05-20 | Discharge: 2020-05-20 | Disposition: A | Discharge status: Home / Self Care | Payer: No Typology Code available for payment source | Source: Ambulatory Visit | Attending: Family Medicine | Admitting: Family Medicine

## 2020-05-20 VITALS — BP 108/74 | HR 89 | Ht 61.8 in | Wt 177.2 lb

## 2020-05-20 DIAGNOSIS — Z113 Encounter for screening for infections with a predominantly sexual mode of transmission: Secondary | ICD-10-CM | POA: Diagnosis not present

## 2020-05-20 DIAGNOSIS — Z124 Encounter for screening for malignant neoplasm of cervix: Secondary | ICD-10-CM | POA: Diagnosis not present

## 2020-05-20 DIAGNOSIS — E119 Type 2 diabetes mellitus without complications: Secondary | ICD-10-CM | POA: Diagnosis not present

## 2020-05-20 DIAGNOSIS — N898 Other specified noninflammatory disorders of vagina: Secondary | ICD-10-CM

## 2020-05-20 DIAGNOSIS — N760 Acute vaginitis: Secondary | ICD-10-CM | POA: Insufficient documentation

## 2020-05-20 LAB — POCT GLYCOSYLATED HEMOGLOBIN (HGB A1C): Hemoglobin A1C: 14 % — AB (ref 4.0–5.6)

## 2020-05-20 MED ORDER — FLUCONAZOLE 150 MG PO TABS: 150.0000 mg | ORAL_TABLET | Freq: Once | ORAL | 0 refills | Status: AC

## 2020-05-20 MED ORDER — NYSTATIN 100000 UNIT/GM EX CREA: 1.0000 "application " | TOPICAL_CREAM | Freq: Four times a day (QID) | CUTANEOUS | 1 refills | Status: AC

## 2020-05-20 NOTE — Assessment & Plan Note (Signed)
Obtained pap smear today.  

## 2020-05-20 NOTE — Assessment & Plan Note (Addendum)
Vulvovaginitis likely 2/2 to poorly controlled diabetes (A1c today 14). Wet prep obtained incl yeast (send out). On exam: erythematous vuvla and intertrigo of pannus fold. Treated with 1150mg  diflucan and nystatin cream.

## 2020-05-20 NOTE — Patient Instructions (Addendum)
Thank you for coming to see me today. It was a pleasure. You likely have a yeast infection which is causing your symptoms. I have prescribed you an oral tablet and cream to apply to the affected area.  We will get some labs today.  If they are abnormal or we need to do something about them, I will call you.  If they are normal, I will send you a message on MyChart (if it is active) or a letter in the mail.  If you don't hear from [place] in 2 weeks, please call the office at the number below.  We performed STD testing today and a pap smear. This will take a few days to come back. If your MyChart is activated, we will message you on there if everything is normal, otherwise we will call. If we need to treat something we will also call you. If you do not hear from [place] in the next 4 days, please give [place] a call.  Please follow-up with me in 3 month for diabetes follow up.  If you have any questions or concerns, please do not hesitate to call the office at [phone].  If you develop fevers>100.5, shortness of breath, chest pain, palpitations, dizziness, abdominal pain, nausea, vomiting, diarrhea or cannot eat or drink then please go to the ER immediately.  Best wishes,   Dr Allena Katz

## 2020-05-20 NOTE — Assessment & Plan Note (Signed)
A1c 14 today. Pt is well appearing, denies sx of HSS/DKA. Patient endorses compliance with medications but has admits to a very poor diet full of sugar. Patient has a poor understanding of diabetes and long term complications which is probably due to the language barrier. I explained the importance of lifestyle modifications for her diabetes. Recommended: -ATC app 1/13 for further diabetes education and to reinforce management plan below -Increase Lantus 15 units tonight, consider increasing further tomorrow  -Increase Ozempic to 1mg  weekly -Continue Metformin 1000mg  BID -Close follow up in the next 1 month

## 2020-05-20 NOTE — Assessment & Plan Note (Signed)
Wet prep obtained including gc/chamydia, candida and trich.

## 2020-05-21 ENCOUNTER — Ambulatory Visit (INDEPENDENT_AMBULATORY_CARE_PROVIDER_SITE_OTHER): Payer: No Typology Code available for payment source | Admitting: Family Medicine

## 2020-05-21 ENCOUNTER — Other Ambulatory Visit: Payer: Self-pay | Admitting: Family Medicine

## 2020-05-21 VITALS — BP 106/80 | HR 94 | Ht 61.0 in | Wt 175.2 lb

## 2020-05-21 DIAGNOSIS — E119 Type 2 diabetes mellitus without complications: Secondary | ICD-10-CM

## 2020-05-21 LAB — GLUCOSE, POCT (MANUAL RESULT ENTRY): POC Glucose: 291 mg/dl — AB (ref 70–99)

## 2020-05-21 MED ORDER — OZEMPIC (0.25 OR 0.5 MG/DOSE) 2 MG/1.5ML ~~LOC~~ SOPN: 1.0000 mg | PEN_INJECTOR | SUBCUTANEOUS | 2 refills | Status: DC

## 2020-05-21 MED ORDER — METFORMIN HCL 1000 MG PO TABS: ORAL_TABLET | ORAL | 1 refills | Status: DC

## 2020-05-21 NOTE — Progress Notes (Signed)
    SUBJECTIVE:   CHIEF COMPLAINT / HPI:   Diabetes Morgan Osborne was seen in clinic yesterday at which time she was found to have an A1c of 14.  Medication adjustments were made yesterday and medication was refilled.  Her current diabetes regimen includes: -Lantus 15 units -Ozempic 1 mg -Metformin 1000 grams twice daily She notes that she was able to get her dose of Ozempic and yesterday in addition to her nighttime Lantus.  She does have a sufficient supply of metformin.  She did not check her blood sugar this morning.  She specifically denies polyuria, abdominal pain, nausea.  She does note that she feels some dry mouth and excessive thirst.  PERTINENT  PMH / PSH: Diabetes  OBJECTIVE:   BP 106/80   Pulse 94   Ht 5\' 1"  (1.549 m)   Wt 175 lb 4 oz (79.5 kg)   SpO2 99%   BMI 33.11 kg/m    General: Alert and cooperative and appears to be in no acute distress HEENT: Moist mucous membranes. Cardio: Normal S1 and S2, no S3 or S4. Rhythm is regular. No murmurs or rubs.   Pulm: Clear to auscultation bilaterally, no crackles, wheezing, or diminished breath sounds. Normal respiratory effort Abdomen: Bowel sounds normal. Abdomen soft and non-tender.  Extremities: No peripheral edema. Warm/ well perfused.  Strong radial pulses.  No skin tenting.  Good cap refill. Neuro: Cranial nerves grossly intact  ASSESSMENT/PLAN:   Diabetes mellitus without complication (HCC) Based on our conversation today, it seems like her A1c has been particularly high because she has been out of medicine for at least the past 2 weeks.  She had all of her medicine refilled at her visit yesterday.  We discussed the importance of checking fasting blood glucose and writing it down.  For today, we will increase her Lantus to 20 units daily and have her diligently check fasting glucose in the morning. -Increase Lantus to 20 units daily -Continue metformin 1000 mg twice daily and Ozempic 1 mg weekly -Return to clinic in 2  weeks with PCP -Ozempic refill sent in, if this does not work, she may benefit from additional samples from pharmacy at her next visit.  She does have sufficient Ozempic to make it to her next visit. -Follow BMP     , MD [facility]

## 2020-05-21 NOTE — Patient Instructions (Addendum)
Diabetes: Your blood sugar is high but it looks like it is doing better compared to yesterday.  I want you to increase your Lantus to 20 units every night.  Check your blood sugar every morning and write it down.  Please bring that paper with you to your next appointment.  This is going to get better but we will need to keep a close eye on you for the next couple of weeks to make sure that you are staying safe.  B?nh ti?u ???ng: L??ng ???ng trong mu c?a b?n cao nh?ng c v? nh? n ?ang ho?t ??ng t?t h?n so v?i ngy hm qua. Ti mu?n b?n t?ng Lantus c?a b?n ln 20 ??n v? m?i ?m. Ki?m tra l??ng ???ng trong mu c?a b?n m?i sng v vi?t ra gi?y. Vui lng mang theo t? gi?y ? trong cu?c h?n ti?p theo. Tnh hnh s? t?t h?n nh?ng chng ti s? c?n theo di st b?n trong vi tu?n t?i ?? ??m b?o r?ng b?n lun an ton.

## 2020-05-21 NOTE — Assessment & Plan Note (Addendum)
Based on our conversation today, it seems like her A1c has been particularly high because she has been out of medicine for at least the past 2 weeks.  She had all of her medicine refilled at her visit yesterday.  We discussed the importance of checking fasting blood glucose and writing it down.  For today, we will increase her Lantus to 20 units daily and have her diligently check fasting glucose in the morning. -Increase Lantus to 20 units daily -Continue metformin 1000 mg twice daily and Ozempic 1 mg weekly -Return to clinic in 2 weeks with PCP -Ozempic refill sent in, if this does not work, she may benefit from additional samples from pharmacy at her next visit.  She does have sufficient Ozempic to make it to her next visit. -Follow BMP

## 2020-05-22 LAB — BASIC METABOLIC PANEL
BUN/Creatinine Ratio: 13 (ref 9–23)
BUN: 10 mg/dL (ref 6–24)
CO2: 21 mmol/L (ref 20–29)
Calcium: 10.3 mg/dL — ABNORMAL HIGH (ref 8.7–10.2)
Chloride: 98 mmol/L (ref 96–106)
Creatinine, Ser: 0.79 mg/dL (ref 0.57–1.00)
GFR calc Af Amer: 104 mL/min/{1.73_m2} (ref >59)
GFR calc non Af Amer: 90 mL/min/{1.73_m2} (ref >59)
Glucose: 280 mg/dL — ABNORMAL HIGH (ref 65–99)
Potassium: 4.4 mmol/L (ref 3.5–5.2)
Sodium: 135 mmol/L (ref 134–144)

## 2020-05-22 LAB — CERVICOVAGINAL ANCILLARY ONLY
Bacterial Vaginitis (gardnerella): NEGATIVE
Candida Glabrata: NEGATIVE
Candida Vaginitis: POSITIVE — AB
Chlamydia: NEGATIVE
Comment: NEGATIVE
Comment: NEGATIVE
Comment: NEGATIVE
Comment: NEGATIVE
Comment: NEGATIVE
Comment: NORMAL
Neisseria Gonorrhea: NEGATIVE
Trichomonas: NEGATIVE

## 2020-05-22 LAB — CYTOLOGY - PAP
Comment: NEGATIVE
Diagnosis: NEGATIVE
High risk HPV: NEGATIVE

## 2020-05-27 ENCOUNTER — Ambulatory Visit: Payer: PRIVATE HEALTH INSURANCE | Admitting: Family Medicine

## 2020-06-10 ENCOUNTER — Ambulatory Visit: Payer: PRIVATE HEALTH INSURANCE | Admitting: Family Medicine

## 2020-06-16 NOTE — Progress Notes (Signed)
     SUBJECTIVE:   CHIEF COMPLAINT / HPI:   Morgan Osborne is a 45 y.o. female presents for diabetes follow up    Diabetes Patient's current diabetic medications include lantus 20U, ozempic 0.5mg  and metformin 1000mg  BID. Tolerating well without side effects.  Patient endorses compliance with these medications. CBG readings averaging in the 139-200 range.  Patient's last A1c was  Lab Results  Component Value Date   HGBA1C 14.0 (A) 05/20/2020   HGBA1C 11.5 (A) 11/25/2019  Denies abdominal pain, blurred vision, polyuria, polydipsia, hypoglycemia. Patient states they understand that diet and exercise can help with her diabetes.  Last Microalbumin, LDL, Creatinine: Lab Results  Component Value Date   LDLCALC 78 11/25/2019   CREATININE 0.79 05/21/2020   Grief Her older sister recently passed away of COVID 56 and her uncle passed away in 12 too. She feels regretful that she was so young. She believes she will get over her sorrow. She feels hopeful about the future and wants to live a long time. Denies low mood, active or passive SI.  PERTINENT  PMH / PSH: DM, vulvovaginitis   OBJECTIVE:   BP 104/60   Pulse 96   Ht 5\' 1"  (1.549 m)   Wt 176 lb 2 oz (79.9 kg)   SpO2 99%   BMI 33.28 kg/m    General: Alert, well appearing, tearful at times Cardio: well perfused  Pulm: normal work of breathing Neuro: Cranial nerves grossly intact   ASSESSMENT/PLAN:   Grief Normal grief reaction to the loss of her sister and uncle. Pt is using religion to deal with her grief and she is very hopeful for the future, in fact she says she wants to live as long as possible to see her children and grandchildren grow up. No active or passive SI. Offered therapy however pt declined. She is very grateful for my help and prefers to follow up in clinic in 2 weeks.   Diabetes mellitus without complication (HCC) CBGs improved over the last 2 weeks. CBGs 139-200. Congratulated pt. Continue Lantus 20U,  metformin 1000mg  BID. Increased Ozempic to 1mg  weekly and sent in refills. Aim will be to wean down lantus as A1c improves. F/u in 2 weeks.     [place], MD PGY-2 [facility]

## 2020-06-17 ENCOUNTER — Other Ambulatory Visit: Payer: Self-pay

## 2020-06-17 ENCOUNTER — Encounter: Payer: Self-pay | Admitting: Family Medicine

## 2020-06-17 ENCOUNTER — Ambulatory Visit (INDEPENDENT_AMBULATORY_CARE_PROVIDER_SITE_OTHER): Payer: PRIVATE HEALTH INSURANCE | Admitting: Family Medicine

## 2020-06-17 VITALS — BP 104/60 | HR 96 | Ht 61.0 in | Wt 176.1 lb

## 2020-06-17 DIAGNOSIS — Z794 Long term (current) use of insulin: Secondary | ICD-10-CM

## 2020-06-17 DIAGNOSIS — F4321 Adjustment disorder with depressed mood: Secondary | ICD-10-CM | POA: Insufficient documentation

## 2020-06-17 DIAGNOSIS — E119 Type 2 diabetes mellitus without complications: Secondary | ICD-10-CM | POA: Diagnosis not present

## 2020-06-17 MED ORDER — LOSARTAN POTASSIUM-HCTZ 50-12.5 MG PO TABS: 1.0000 | ORAL_TABLET | Freq: Every day | ORAL | 3 refills | Status: DC

## 2020-06-17 MED ORDER — OZEMPIC (0.25 OR 0.5 MG/DOSE) 2 MG/1.5ML ~~LOC~~ SOPN: 1.0000 mg | PEN_INJECTOR | SUBCUTANEOUS | 0 refills | Status: DC

## 2020-06-17 MED ORDER — INSULIN GLARGINE 100 UNIT/ML ~~LOC~~ SOLN: 20.0000 [IU] | Freq: Every day | SUBCUTANEOUS | 3 refills | Status: DC

## 2020-06-17 MED ORDER — METFORMIN HCL 1000 MG PO TABS: ORAL_TABLET | ORAL | 1 refills | Status: DC

## 2020-06-17 MED ORDER — ROSUVASTATIN CALCIUM 10 MG PO TABS: 10.0000 mg | ORAL_TABLET | Freq: Every day | ORAL | 3 refills | Status: DC

## 2020-06-17 NOTE — Patient Instructions (Signed)
C?m ?n b?n ? ??n g?p ti ngy hm nay. R?t hn h?nh. Hm nay chng ti ? th?o lu?n v? b?nh ti?u ???ng c?a b?n. n t?t h?n nhi?u. Ti ? t?ng li?u tim m?t l?n m?i tu?n ln 1mg . ti ? g?i thu?c ??n hi?u thu?c.  Vui lng lin h? v?i ti sau 2 tu?n  N?u b?n c b?t k? cu h?i ho?c th?c m?c no, vui lng g?i ??n v?n phng theo s? ([phone]  L?i chc t?t nh?t,  Ti?n s? ) [phone]

## 2020-06-17 NOTE — Assessment & Plan Note (Addendum)
Normal grief reaction to the loss of her sister and uncle. Pt is using religion to deal with her grief and she is very hopeful for the future, in fact she says she wants to live as long as possible to see her children and grandchildren grow up. No active or passive SI. Offered therapy however pt declined. She is very grateful for my help and prefers to follow up in clinic in 2 weeks.

## 2020-06-17 NOTE — Assessment & Plan Note (Addendum)
CBGs improved over the last 2 weeks. CBGs 139-200. Congratulated pt. Continue Lantus 20U, metformin 1000mg  BID. Increased Ozempic to 1mg  weekly and sent in refills. Aim will be to wean down lantus as A1c improves. F/u in 2 weeks.

## 2020-07-01 ENCOUNTER — Ambulatory Visit: Payer: PRIVATE HEALTH INSURANCE | Admitting: Family Medicine

## 2020-07-01 NOTE — Progress Notes (Deleted)
     SUBJECTIVE:   CHIEF COMPLAINT / HPI:   Morgan Osborne is a 45 y.o. female presents with ***    Diabetes Patient's current diabetic medications include Ozempic 1mg , lantus and metformin. Tolerating well without side effects.  Patient endorses compliance with these medications. CBG readings averaging in the *** range.  Patient's last A1c was  Lab Results  Component Value Date   HGBA1C 14.0 (A) 05/20/2020   HGBA1C 11.5 (A) 11/25/2019   on **.  Current A1c today is***.  Denies abdominal pain, blurred vision, polyuria, polydipsia, hypoglycemia ***. Patient states they understand that diet and exercise can help with her diabetes.***.    Last Microalbumin, LDL, Creatinine: Lab Results  Component Value Date   LDLCALC 78 11/25/2019   CREATININE 0.79 05/21/2020    Grief   PERTINENT  PMH / PSH: HTN, DM  OBJECTIVE:   There were no vitals taken for this visit.   General: Alert, no acute distress, pleasant Cardio: Normal S1 and S2, RRR, no r/m/g Pulm: CTAB, normal work of breathing Abdomen: Bowel sounds normal. Abdomen soft and non-tender.  Extremities: No peripheral edema.  Neuro: Cranial nerves grossly intact   ASSESSMENT/PLAN:   No problem-specific Assessment & Plan notes found for this encounter.     05/23/2020, MD PGY-2 [facility]

## 2020-08-13 ENCOUNTER — Other Ambulatory Visit: Payer: Self-pay

## 2020-08-13 ENCOUNTER — Ambulatory Visit (INDEPENDENT_AMBULATORY_CARE_PROVIDER_SITE_OTHER): Payer: No Typology Code available for payment source | Admitting: Family Medicine

## 2020-08-13 ENCOUNTER — Encounter: Payer: Self-pay | Admitting: Family Medicine

## 2020-08-13 VITALS — BP 112/68 | HR 88 | Ht 61.0 in | Wt 178.6 lb

## 2020-08-13 DIAGNOSIS — R1011 Right upper quadrant pain: Secondary | ICD-10-CM | POA: Diagnosis not present

## 2020-08-13 DIAGNOSIS — E119 Type 2 diabetes mellitus without complications: Secondary | ICD-10-CM

## 2020-08-13 DIAGNOSIS — K219 Gastro-esophageal reflux disease without esophagitis: Secondary | ICD-10-CM | POA: Insufficient documentation

## 2020-08-13 DIAGNOSIS — R21 Rash and other nonspecific skin eruption: Secondary | ICD-10-CM

## 2020-08-13 LAB — POCT GLYCOSYLATED HEMOGLOBIN (HGB A1C): HbA1c, POC (controlled diabetic range): 8.8 % — AB (ref 0.0–7.0)

## 2020-08-13 MED ORDER — CETIRIZINE HCL 10 MG PO TABS: 10.0000 mg | ORAL_TABLET | Freq: Every day | ORAL | 11 refills | Status: DC

## 2020-08-13 MED ORDER — ROSUVASTATIN CALCIUM 10 MG PO TABS: 10.0000 mg | ORAL_TABLET | Freq: Every day | ORAL | 3 refills | Status: DC

## 2020-08-13 MED ORDER — INSULIN GLARGINE 100 UNIT/ML ~~LOC~~ SOLN: 16.0000 [IU] | Freq: Every day | SUBCUTANEOUS | 3 refills | Status: DC

## 2020-08-13 MED ORDER — CALCIUM CARBONATE ANTACID 500 MG PO CHEW: 1.0000 | CHEWABLE_TABLET | Freq: Every day | ORAL | 3 refills | Status: DC

## 2020-08-13 MED ORDER — OZEMPIC (0.25 OR 0.5 MG/DOSE) 2 MG/1.5ML ~~LOC~~ SOPN: 1.5000 mg | PEN_INJECTOR | SUBCUTANEOUS | 0 refills | Status: AC

## 2020-08-13 MED ORDER — LOSARTAN POTASSIUM-HCTZ 50-12.5 MG PO TABS: 1.0000 | ORAL_TABLET | Freq: Every day | ORAL | 3 refills | Status: DC

## 2020-08-13 MED ORDER — OZEMPIC (0.25 OR 0.5 MG/DOSE) 2 MG/1.5ML ~~LOC~~ SOPN: 1.0000 mg | PEN_INJECTOR | SUBCUTANEOUS | 0 refills | Status: DC

## 2020-08-13 MED ORDER — METFORMIN HCL 1000 MG PO TABS: ORAL_TABLET | ORAL | 1 refills | Status: DC

## 2020-08-13 NOTE — Assessment & Plan Note (Addendum)
No red flag symptoms for abdominal pain. Diet modifications recommendations given. Recommended PRN Tums for sx. If worsens will discuss other options.

## 2020-08-13 NOTE — Assessment & Plan Note (Addendum)
Pt's A1C has improved from 14 to 8.8 in the last 3 months. Congratulated pt on her efforts and lifestyle modifications. Continue 1000mg  Metformin BID, decreased Lantus to 16units and increased ozempic to 1.5mg  weekly. F/u  A1c in 3 months.

## 2020-08-13 NOTE — Progress Notes (Signed)
     SUBJECTIVE:   CHIEF COMPLAINT / HPI:   Morgan Osborne is a 45 y.o. female presents for diabetes follow up   Diabetes Patient's current diabetic medications include  Metformin, lantus and ozempic.  Tolerating well without side effects.  Patient endorses compliance with these medications. CBG readings averaging in the 120-150 range. Pt has been exercise more since previous visit. Lab Results  Component Value Date   HGBA1C 8.8 (A) 08/13/2020   HGBA1C 14.0 (A) 05/20/2020   HGBA1C 11.5 (A) 11/25/2019   Current A1c today is 8.8.  Denies abdominal pain, blurred vision, polyuria, polydipsia, hypoglycemia.     GERD Pt notes that she has had more heartburn in the last two months, however this only presents predictably after consuming spicy foods. She denies vomiting or abdo pain. She has not used any over the counter medications for symptom relief. She would like Tums.  Last Microalbumin, LDL, Creatinine: Lab Results  Component Value Date   LDLCALC 78 11/25/2019   CREATININE 0.67 08/13/2020    Flowsheet Row Office Visit from 08/13/2020 in [facility]  PHQ-9 Total Score 4       Health Maintenance Due  Topic  . PNEUMOCOCCAL POLYSACCHARIDE VACCINE AGE 90-64 HIGH RISK   . FOOT EXAM   . OPHTHALMOLOGY EXAM   . TETANUS/TDAP   . COLONOSCOPY (Pts 45-75yrs Insurance coverage will need to be confirmed)      PERTINENT  PMH / PSH: GERD, DM   OBJECTIVE:   BP 112/68   Pulse 88   Ht 5\' 1"  (1.549 m)   Wt 178 lb 9.6 oz (81 kg)   SpO2 99%   BMI 33.75 kg/m    General: Alert, no acute distress Cardio: well perfused Pulm: normal work of breathing Abdomen: Bowel sounds normal. Abdomen soft, tender in RUQ, no guarding, murphy's negative Neuro: Cranial nerves grossly intact   ASSESSMENT/PLAN:   Diabetes mellitus without complication (HCC) Pt's A1C has improved from 14 to 8.8 in the last 3 months. Congratulated pt on her efforts and lifestyle modifications. Continue  1000mg  Metformin BID, decreased Lantus to 16units and increased ozempic to 1.5mg  weekly. F/u  A1c in 3 months.  GERD (gastroesophageal reflux disease) No red flag symptoms for abdominal pain. Diet modifications recommendations given. Recommended PRN Tums for sx. If worsens will discuss other options.  RUQ pain RUQ tenderness on palpation today. Broad differential including bilary colic, pancreatitis, PUD, hepatitis etc. Will obtain CMP with lipase today. Ordered RUQ . Follow up with me on 25th April.     [place], MD PGY-2 [facility]

## 2020-08-13 NOTE — Patient Instructions (Addendum)
C?m ?n b?n ? ??n g?p ti ngy hm nay. R?t hn h?nh. Hm nay chng ti ? th?o lu?n v? b?nh ti?u ???ng c?a b?n. n ? t?t h?n nhi?u ngy hm nay. Ti t? ho v? b?n! Ti khuyn b?n nn gi?m li?u tim m?t l?n m?i ngy g?i l lantus xu?ng cn 16 ??n v?. t?ng ozempic ln 1,5mg  m?i tu?n m?t l?n k? t? tu?n sau.  siu m c?a b?n s? vo lc 7 gi? 43 sng th? n?m ngy 14 thng 4. b?n khng th? ?n ho?c u?ng sau n?a ?m vo ?m hm tr??c.  n?u b?n c?n h?y b? trong vng 48 gi? ho?c b?n s? tnh ph $ 75 ? la cho thng d?ch vin.  N?u b?n c b?t k? cu h?i ho?c th?c m?c no, vui lng g?i ??n v?n phng theo s? [phone].  L?i chc t?t nh?t,  Ti?n s? Allena Katz

## 2020-08-14 LAB — COMPREHENSIVE METABOLIC PANEL
ALT: 12 IU/L (ref 0–32)
AST: 11 IU/L (ref 0–40)
Albumin/Globulin Ratio: 1.7 (ref 1.2–2.2)
Albumin: 4.8 g/dL (ref 3.8–4.8)
Alkaline Phosphatase: 72 IU/L (ref 44–121)
BUN/Creatinine Ratio: 13 (ref 9–23)
BUN: 9 mg/dL (ref 6–24)
Bilirubin Total: 0.4 mg/dL (ref 0.0–1.2)
CO2: 24 mmol/L (ref 20–29)
Calcium: 10.2 mg/dL (ref 8.7–10.2)
Chloride: 98 mmol/L (ref 96–106)
Creatinine, Ser: 0.67 mg/dL (ref 0.57–1.00)
Globulin, Total: 2.8 g/dL (ref 1.5–4.5)
Glucose: 114 mg/dL — ABNORMAL HIGH (ref 65–99)
Potassium: 4.4 mmol/L (ref 3.5–5.2)
Sodium: 139 mmol/L (ref 134–144)
Total Protein: 7.6 g/dL (ref 6.0–8.5)
eGFR: 109 mL/min/{1.73_m2} (ref >59)

## 2020-08-14 LAB — LIPASE: Lipase: 36 U/L (ref 14–72)

## 2020-08-15 DIAGNOSIS — R1011 Right upper quadrant pain: Secondary | ICD-10-CM | POA: Insufficient documentation

## 2020-08-15 NOTE — Assessment & Plan Note (Signed)
RUQ tenderness on palpation today. Broad differential including bilary colic, pancreatitis, PUD, hepatitis etc. Will obtain CMP with lipase today. Ordered RUQ [place]. Follow up with me on 25th April.

## 2020-08-20 ENCOUNTER — Ambulatory Visit
Admission: RE | Admit: 2020-08-20 | Discharge: 2020-08-20 | Disposition: A | Discharge status: Home / Self Care | Payer: No Typology Code available for payment source | Source: Ambulatory Visit | Attending: Family Medicine | Admitting: Family Medicine

## 2020-08-20 DIAGNOSIS — R1011 Right upper quadrant pain: Secondary | ICD-10-CM

## 2020-08-31 ENCOUNTER — Ambulatory Visit: Payer: No Typology Code available for payment source | Admitting: Family Medicine

## 2020-09-11 ENCOUNTER — Other Ambulatory Visit: Payer: Self-pay

## 2020-09-11 ENCOUNTER — Ambulatory Visit (INDEPENDENT_AMBULATORY_CARE_PROVIDER_SITE_OTHER): Payer: No Typology Code available for payment source | Admitting: Family Medicine

## 2020-09-11 ENCOUNTER — Encounter: Payer: Self-pay | Admitting: Family Medicine

## 2020-09-11 DIAGNOSIS — R1011 Right upper quadrant pain: Secondary | ICD-10-CM | POA: Diagnosis not present

## 2020-09-11 DIAGNOSIS — E119 Type 2 diabetes mellitus without complications: Secondary | ICD-10-CM

## 2020-09-11 NOTE — Patient Instructions (Signed)
C?m ?n b?n ? ??n g?p ti ngy hm nay. R?t hn h?nh. Hm nay chng ti ? th?o lu?n v? k?t qu? siu m c?a b?n l m tnh. Ti?p t?c dng thu?c Lantus 16 ??n v? v tim ozempic 1,5mg  m?i tu?n.  Vui lng theo di v?i ti sau 8 tu?n  N?u b?n c b?t k? cu h?i ho?c th?c m?c no, vui lng g?i ??n v?n phng theo s? [phone].  L?i chc t?t nh?t,  Ti?n s? Allena Katz

## 2020-09-11 NOTE — Progress Notes (Signed)
     SUBJECTIVE:   CHIEF COMPLAINT / HPI:   Morgan Osborne is a 45 y.o. female presents for imaging follow up  Imaging results RUQ [place] ordered a few weeks ago due to colicly RUQ pain: results negative.  Flowsheet Row Office Visit from 09/11/2020 in [facility]  PHQ-9 Total Score 2     Diabetes Fasting CBGs in 90-130s. Clarified ozempic dose should be 1.5mg . Lantus dose should be 16 units.   PERTINENT  PMH / PSH: DM, HTN  OBJECTIVE:   BP 118/74   Pulse 94   Ht 5\' 1"  (1.549 m)   Wt 181 lb 6.4 oz (82.3 kg)   SpO2 99%   BMI 34.28 kg/m    General: Alert, no acute distress, pleasant  Cardio: well perfused  Pulm: normal work of breathing Neuro: Cranial nerves grossly intact   ASSESSMENT/PLAN:   Diabetes mellitus without complication (HCC) CBGs 90-130. Last A1c 1 month ago was 8.8 from 14, clarified pt's medications and doses. Congratulated pt on her efforts. No changes made today.   RUQ pain RUQ ordered at last visit wnl. Labs also normal including LFTs and lipase. Explained this to the patient.      , MD PGY-2 [facility]

## 2020-09-11 NOTE — Assessment & Plan Note (Signed)
RUQ ordered at last visit wnl. Labs also normal including LFTs and lipase. Explained this to the patient.

## 2020-09-11 NOTE — Assessment & Plan Note (Signed)
CBGs 90-130. Last A1c 1 month ago was 8.8 from 14, clarified pt's medications and doses. Congratulated pt on her efforts. No changes made today.

## 2020-09-22 ENCOUNTER — Other Ambulatory Visit: Payer: Self-pay | Admitting: Family Medicine

## 2020-09-22 ENCOUNTER — Telehealth: Payer: Self-pay

## 2020-09-22 MED ORDER — OZEMPIC (0.25 OR 0.5 MG/DOSE) 2 MG/1.5ML ~~LOC~~ SOPN: 0.5000 mg | PEN_INJECTOR | SUBCUTANEOUS | 1 refills | Status: DC

## 2020-09-22 NOTE — Telephone Encounter (Signed)
Refill sent.

## 2020-09-22 NOTE — Telephone Encounter (Signed)
Patient's niece calls nurse line requesting rx for pen needles for Ozempic. I am unable to find medication on current med list. Niece reports that patient has been taking medication weekly as prescribed.   Please advise.   Veronda Prude, RN

## 2020-09-23 ENCOUNTER — Other Ambulatory Visit: Payer: Self-pay | Admitting: Family Medicine

## 2020-09-23 NOTE — Telephone Encounter (Signed)
Niece LVM on nurse line stating they received Ozempic, however not the pen needles to go with it. Please advise as I do not see pen needles on medication list.

## 2020-09-24 ENCOUNTER — Telehealth: Payer: Self-pay | Admitting: Family Medicine

## 2020-09-24 NOTE — Telephone Encounter (Signed)
Pt walked in to request refill of:  Name of Medication(s):  Insulin, lancets, and test strips  Last date of OV:  09/11/20 Pharmacy:  [facility]  Completely out  Will route refill request to [facility].  Discussed with patient policy to call pharmacy for future refills.  Also, discussed refills may take up to 48 hours to approve or deny.  Vilinda Blanks

## 2020-09-25 ENCOUNTER — Other Ambulatory Visit: Payer: Self-pay | Admitting: Family Medicine

## 2020-09-25 MED ORDER — INSULIN PEN NEEDLE 29G X 12.7MM MISC: 1 refills | Status: DC

## 2020-09-25 MED ORDER — OZEMPIC (0.25 OR 0.5 MG/DOSE) 2 MG/1.5ML ~~LOC~~ SOPN: 1.0000 mg | PEN_INJECTOR | SUBCUTANEOUS | 2 refills | Status: DC

## 2020-09-25 NOTE — Telephone Encounter (Signed)
Sent!

## 2020-11-13 ENCOUNTER — Encounter: Payer: Self-pay | Admitting: Family Medicine

## 2020-11-13 ENCOUNTER — Other Ambulatory Visit: Payer: No Typology Code available for payment source

## 2020-11-13 ENCOUNTER — Ambulatory Visit (INDEPENDENT_AMBULATORY_CARE_PROVIDER_SITE_OTHER): Payer: No Typology Code available for payment source | Admitting: Family Medicine

## 2020-11-13 ENCOUNTER — Other Ambulatory Visit: Payer: Self-pay

## 2020-11-13 VITALS — BP 122/78 | HR 90 | Ht 61.0 in | Wt 181.8 lb

## 2020-11-13 DIAGNOSIS — Z23 Encounter for immunization: Secondary | ICD-10-CM | POA: Diagnosis not present

## 2020-11-13 DIAGNOSIS — E785 Hyperlipidemia, unspecified: Secondary | ICD-10-CM | POA: Diagnosis not present

## 2020-11-13 DIAGNOSIS — E119 Type 2 diabetes mellitus without complications: Secondary | ICD-10-CM | POA: Diagnosis not present

## 2020-11-13 DIAGNOSIS — E1169 Type 2 diabetes mellitus with other specified complication: Secondary | ICD-10-CM | POA: Diagnosis not present

## 2020-11-13 LAB — POCT GLYCOSYLATED HEMOGLOBIN (HGB A1C): HbA1c, POC (controlled diabetic range): 8.8 % — AB (ref 0.0–7.0)

## 2020-11-13 MED ORDER — EMPAGLIFLOZIN 10 MG PO TABS
10.0000 mg | ORAL_TABLET | Freq: Every day | ORAL | 3 refills | Status: DC
  Filled 2021-07-02: qty 30, 30d supply, fill #0
  Filled 2021-07-26: qty 30, 30d supply, fill #1

## 2020-11-13 NOTE — Addendum Note (Signed)
Addended by: Georges Lynch T on: 11/13/2020 04:41 PM   Modules accepted: Orders, SmartSet

## 2020-11-13 NOTE — Assessment & Plan Note (Signed)
Obtain lipid panel today.  Continue Crestor 10mg .

## 2020-11-13 NOTE — Patient Instructions (Signed)
C?m ?n b?n ? ??n g?p ti ngy hm nay. R?t hn h?nh. Hm nay chng ti ? th?o lu?n v? b?nh ti?u ???ng c?a b?n. N ?n ??nh ? m?c 8,8. Ti ? b?t ??u cho b?n m?t lo?i thu?c m?i c tn l jardiance ?? gip ki?m sot l??ng ???ng trong mu. Ti?p t?c ?n t th?c ?n c ???ng v t?p th? d?c.  Vui lng theo di v?i ti sau 3 thng.  N?u b?n c b?t k? cu h?i ho?c th?c m?c no, vui lng g?i ??n v?n phng theo s? [phone].  L?i chc t?t nh?t,  Ti?n s? Allena Katz   Thank you for coming to see me today. It was a pleasure. Today we discussed your diabetes. It is stable at 8.8. I have started you on a new medication called jardiance to help with blood sugar control.  Continue eating less sugary foods and exercising.  Please follow-up with me in 3 months.  If you have any questions or concerns, please do not hesitate to call the office at [phone].  Best wishes,   Dr Allena Katz

## 2020-11-13 NOTE — Assessment & Plan Note (Addendum)
A1c 8.8. Stable from previous. Goal 7-8.  Offered patient increasing Lantus dose versus starting Jardiance.  Patient would like to try Jardiance.  Prescribed Jardiance 10 mg.  Normal diabetic foot exam. Diet and exercise counseling provided.  Follow-up in 3 months.

## 2020-11-13 NOTE — Progress Notes (Signed)
     SUBJECTIVE:   CHIEF COMPLAINT / HPI:   Morgan Osborne is a 45 y.o. female presents for diabetes follow up  A vietnamese speaking interpretor was using for the entirety of this encounter.    Diabetes Patient's current diabetic medications include Lantus 16 units, metformin and ozempic . Tolerating well without side effects.  Patient endorses compliance with these medications. CBG readings averaging in the 103-169 range.  Patient's last A1c was  Lab Results  Component Value Date   HGBA1C 8.8 (A) 11/13/2020   HGBA1C 8.8 (A) 08/13/2020   HGBA1C 14.0 (A) 05/20/2020   Denies abdominal pain, blurred vision, polyuria, polydipsia, hypoglycemia. Patient states they understand that diet and exercise can help with her diabetes.  Last Microalbumin, LDL, Creatinine: Lab Results  Component Value Date   LDLCALC 78 11/25/2019   CREATININE 0.67 08/13/2020   HLD Patient is currently taking Crestor. Endorses compliance and tolerating well without side effects. Denies RUQ pain or myalgias.   Flowsheet Row Office Visit from 09/11/2020 in [facility]  PHQ-9 Total Score 2       PERTINENT  PMH / PSH: DM, GERD  OBJECTIVE:   BP 122/78   Pulse 90   Ht 5\' 1"  (1.549 m)   Wt 181 lb 12.8 oz (82.5 kg)   SpO2 99%   BMI 34.35 kg/m    General: Alert, no acute distress, pleasant  Cardio: Normal S1 and S2, RRR, no r/m/g Pulm: CTAB, normal work of breathing Abdomen: Bowel sounds normal. Abdomen soft and non-tender.  Extremities: No peripheral edema.  Neuro: Cranial nerves grossly intact   Diabetic foot exam: No deformities, ulcerations, or other skin breakdown on feet bilaterally.  Sensation intact to monofilament and light touch.  PT and DP pulses intact BL.    ASSESSMENT/PLAN:   Diabetes mellitus without complication (HCC) A1c 8.8. Stable from previous. Goal 7-8.  Offered patient increasing Lantus dose versus starting Jardiance.  Patient would like to try Jardiance.   Prescribed Jardiance 10 mg.  Normal diabetic foot exam. Diet and exercise counseling provided.  Follow-up in 3 months.  Hyperlipidemia associated with type 2 diabetes mellitus (HCC) Obtain lipid panel today.  Continue Crestor 10mg .     , MD PGY-3 [facility]

## 2020-11-16 ENCOUNTER — Other Ambulatory Visit: Payer: No Typology Code available for payment source

## 2020-11-16 ENCOUNTER — Other Ambulatory Visit: Payer: Self-pay

## 2020-11-17 LAB — LIPID PANEL
Chol/HDL Ratio: 3.4 ratio (ref 0.0–4.4)
Cholesterol, Total: 116 mg/dL (ref 100–199)
HDL: 34 mg/dL — ABNORMAL LOW (ref >39)
LDL Chol Calc (NIH): 58 mg/dL (ref 0–99)
Triglycerides: 137 mg/dL (ref 0–149)
VLDL Cholesterol Cal: 24 mg/dL (ref 5–40)

## 2020-12-09 ENCOUNTER — Other Ambulatory Visit: Payer: Self-pay | Admitting: Family Medicine

## 2020-12-09 ENCOUNTER — Telehealth: Payer: Self-pay | Admitting: Family Medicine

## 2020-12-09 NOTE — Telephone Encounter (Signed)
Spoke to Niece. Pt needs Ozempic sent to [facility] on [address] as [facility] is out of stock. Sunday Spillers, CMA

## 2020-12-09 NOTE — Telephone Encounter (Signed)
Called Niece. [facility] of [facility]. Sent message to PCP asking for Rx to be sent to [facility] on [address]. Sunday Spillers, CMA

## 2020-12-09 NOTE — Telephone Encounter (Signed)
Previous note I left niece is the contact person her name is Garen Lah [phone].  Please contact her to get this confustion straightened out about her meds and needles.

## 2020-12-09 NOTE — Telephone Encounter (Signed)
Spoke to niece. Pt is International aid/development worker. [facility] is [facility] at [facility]. Sunday Spillers, CMA

## 2020-12-09 NOTE — Telephone Encounter (Signed)
Patient came by looked at her rx bottle of [place]on Allan she has 11 refills until 11/13/21.  Advised her of this but she states that she needs needles.  Advised would have nurse to call her she showed me some kind of needle not sure.  I told her would her nurse to call to make sure that's what she needs.

## 2020-12-10 ENCOUNTER — Other Ambulatory Visit: Payer: Self-pay | Admitting: Family Medicine

## 2020-12-10 MED ORDER — OZEMPIC (0.25 OR 0.5 MG/DOSE) 2 MG/1.5ML ~~LOC~~ SOPN: 1.0000 mg | PEN_INJECTOR | SUBCUTANEOUS | 0 refills | Status: DC

## 2020-12-10 NOTE — Telephone Encounter (Signed)
Received phone call from pharmacy. They do have ozempic 4mg /3 ml available.   Please advise if rx can be updated. Pharmacist reports that 1 mg weekly, typically is prescribed as 4 mg/ 3 ml.   , RN

## 2020-12-10 NOTE — Telephone Encounter (Signed)
Done! I put in a separate order so will "refuse" the rest of these requests.

## 2020-12-10 NOTE — Telephone Encounter (Signed)
This has been refilled. Please could you inform the patient. Thank you!

## 2020-12-10 NOTE — Telephone Encounter (Signed)
Thanks please let me know which pharmacy to send it and I would be happy to do so!

## 2020-12-10 NOTE — Telephone Encounter (Signed)
Received returned call from patient's niece. [facility] also has medication on back order. Estimated shipment should come in one to two weeks.   Patient is requesting possible alternative to be sent to the pharmacy or if we may have samples to last until shipment arrives.   Please advise.   Veronda Prude, RN

## 2020-12-12 ENCOUNTER — Other Ambulatory Visit: Payer: Self-pay | Admitting: Family Medicine

## 2020-12-13 ENCOUNTER — Other Ambulatory Visit: Payer: Self-pay | Admitting: Family Medicine

## 2020-12-14 NOTE — Telephone Encounter (Signed)
Called in medication over the phone.Pharmacy will let the patient know :)

## 2020-12-14 NOTE — Telephone Encounter (Signed)
Just FYI I called in the correct dose over the phone.

## 2020-12-29 ENCOUNTER — Other Ambulatory Visit: Payer: Self-pay | Admitting: Family Medicine

## 2021-01-15 ENCOUNTER — Ambulatory Visit (INDEPENDENT_AMBULATORY_CARE_PROVIDER_SITE_OTHER): Payer: No Typology Code available for payment source

## 2021-01-15 ENCOUNTER — Ambulatory Visit (HOSPITAL_COMMUNITY)
Admission: EM | Admit: 2021-01-15 | Discharge: 2021-01-15 | Disposition: A | Discharge status: Home / Self Care | Payer: No Typology Code available for payment source | Attending: Physician Assistant | Admitting: Physician Assistant

## 2021-01-15 ENCOUNTER — Encounter (HOSPITAL_COMMUNITY): Payer: Self-pay

## 2021-01-15 ENCOUNTER — Other Ambulatory Visit: Payer: Self-pay

## 2021-01-15 DIAGNOSIS — M79671 Pain in right foot: Secondary | ICD-10-CM

## 2021-01-15 MED ORDER — IBUPROFEN 600 MG PO TABS: 600.0000 mg | ORAL_TABLET | Freq: Four times a day (QID) | ORAL | 0 refills | Status: DC | PRN

## 2021-01-15 NOTE — Discharge Instructions (Addendum)
Apply ice to affected area Rest and elevate Wear shoe with ambulation Take ibuprofen as needed Follow up with orthopedics if no improvement

## 2021-01-15 NOTE — ED Triage Notes (Signed)
Pt presents with right ankle and foot pain and swelling. Pt states she is only walk a little bit.

## 2021-01-15 NOTE — ED Provider Notes (Signed)
[facility]    CSN: [id number] Arrival date & time: 01/15/21  1325      History   Chief Complaint Chief Complaint  Patient presents with   Joint Swelling    Ankle   Foot Swelling    HPI Morgan Osborne is a 45 y.o. female.   Pt complains of right foot pain and swelling that started several days ago.  Denies injury or trauma.  Reports pain is worse with weight bearing.  She reports she is on her feet a lot, but does not reports excessive activity.  She has taken tylenol with no improvement.  No problems with this foot previously.  Sister is translating for her today.    Past Medical History:  Diagnosis Date   Diabetes mellitus without complication [facility])     Patient Active Problem List   Diagnosis Date Noted   Hyperlipidemia associated with type 2 diabetes mellitus (HCC) 11/13/2020   RUQ pain 08/15/2020   GERD (gastroesophageal reflux disease) 08/13/2020   Grief 06/17/2020   Cervical cancer screening 05/20/2020   Vulvovaginitis 05/20/2020   Screening examination for STD (sexually transmitted disease) 11/25/2019   Diabetes mellitus without complication (HCC) 11/25/2019   Birth control counseling 11/25/2019    History reviewed. No pertinent surgical history.  OB History   No obstetric history on file.      Home Medications    Prior to Admission medications   Medication Sig Start Date End Date Taking? Authorizing Provider  ibuprofen (ADVIL) 600 MG tablet Take 1 tablet (600 mg total) by mouth every 6 (six) hours as needed. 01/15/21  Yes Ward, Tylene Fantasia, PA-C  CAL-GEST ANTACID 500 MG chewable tablet CHEW 1 TABLET (200 MG OF ELEMENTAL CALCIUM TOTAL) BY MOUTH DAILY. 12/29/20   Towanda Octave, MD  cetirizine (ZYRTEC) 10 MG tablet Take 1 tablet (10 mg total) by mouth daily. 08/13/20   Towanda Octave, MD  empagliflozin (JARDIANCE) 10 MG TABS tablet Take 1 tablet (10 mg total) by mouth daily. 11/13/20   Towanda Octave, MD  insulin glargine (LANTUS) 100 UNIT/ML injection Inject  0.16 mLs (16 Units total) into the skin daily. 08/13/20   Towanda Octave, MD  Insulin Pen Needle 29G X 12.7MM MISC Use with ozempic 09/25/20   Jackelyn Poling, DO  losartan-hydrochlorothiazide (HYZAAR) 50-12.5 MG tablet TAKE 1 TABLET BY MOUTH EVERY DAY 12/14/20   Towanda Octave, MD  metFORMIN (GLUCOPHAGE) 1000 MG tablet ONE TABLET BY MOUTH TWICE A DAY 08/13/20   Towanda Octave, MD  rosuvastatin (CRESTOR) 10 MG tablet TAKE 1 TABLET BY MOUTH EVERY DAY 12/14/20   Towanda Octave, MD    Family History Family History  Family history unknown: Yes    Social History Social History   Tobacco Use   Smoking status: Never   Smokeless tobacco: Never  Substance Use Topics   Alcohol use: No   Drug use: No     Allergies   Patient has no known allergies.   Review of Systems Review of Systems  Constitutional:  Negative for chills and fever.  HENT:  Negative for ear pain and sore throat.   Eyes:  Negative for pain and visual disturbance.  Respiratory:  Negative for cough and shortness of breath.   Cardiovascular:  Negative for chest pain and palpitations.  Gastrointestinal:  Negative for abdominal pain and vomiting.  Genitourinary:  Negative for dysuria and hematuria.  Musculoskeletal:  Positive for arthralgias (right foot pain). Negative for back pain.  Skin:  Negative for color change and  rash.  Neurological:  Negative for seizures and syncope.  All other systems reviewed and are negative.   Physical Exam Triage Vital Signs ED Triage Vitals  Enc Vitals Group     BP 01/15/21 1547 107/72     Pulse Rate 01/15/21 1547 89     Resp 01/15/21 1547 18     Temp 01/15/21 1548 98.1 F (36.7 C)     Temp Source 01/15/21 1548 Oral     SpO2 01/15/21 1547 100 %     Weight --      Height --      Head Circumference --      Peak Flow --      Pain Score 01/15/21 1547 8     Pain Loc --      Pain Edu? --      Excl. in GC? --    No data found.  Updated Vital Signs BP 107/72 (BP Location: Right Arm)   Pulse  89   Temp 98.1 F (36.7 C) (Oral)   Resp 18   SpO2 100%   Visual Acuity Right Eye Distance:   Left Eye Distance:   Bilateral Distance:    Right Eye Near:   Left Eye Near:    Bilateral Near:     Physical Exam Vitals and nursing note reviewed.  Constitutional:      General: She is not in acute distress.    Appearance: She is well-developed.  HENT:     Head: Normocephalic and atraumatic.  Eyes:     Conjunctiva/sclera: Conjunctivae normal.  Cardiovascular:     Rate and Rhythm: Normal rate and regular rhythm.     Heart sounds: No murmur heard. Pulmonary:     Effort: Pulmonary effort is normal. No respiratory distress.     Breath sounds: Normal breath sounds.  Abdominal:     Palpations: Abdomen is soft.     Tenderness: There is no abdominal tenderness.  Musculoskeletal:     Cervical back: Neck supple.       Feet:  Skin:    General: Skin is warm and dry.  Neurological:     Mental Status: She is alert.     UC Treatments / Results  Labs (all labs ordered are listed, but only abnormal results are displayed) Labs Reviewed - No data to display  EKG   Radiology DG Foot 2 Views Right  Result Date: 01/15/2021 CLINICAL DATA:  Right foot pain and swelling. EXAM: RIGHT FOOT - 2 VIEW COMPARISON:  None. FINDINGS: No acute fracture or dislocation. Joint spaces are preserved. Bone mineralization is normal. Plantar and Achilles enthesophytes. Mild diffuse soft tissue swelling. IMPRESSION: 1. Mild diffuse soft tissue swelling. No acute osseous abnormality. Electronically Signed   By: Obie Dredge M.D.   On: 01/15/2021 17:15    Procedures Procedures (including critical care time)  Medications Ordered in UC Medications - No data to display  Initial Impression / Assessment and Plan / UC Course  I have reviewed the triage vital signs and the nursing notes.  Pertinent labs & imaging results that were available during my care of the patient were reviewed by me and considered in  my medical decision making (see chart for details).     Xray negative for fracture.  Post op shoe given.  Ibuprofen prescribed.  Advised ice and elevation.  If no improvement follow up with ortho. Return precautions given.  Final Clinical Impressions(s) / UC Diagnoses   Final diagnoses:  Right foot pain  Discharge Instructions      Apply ice to affected area Rest and elevate Wear shoe with ambulation Take ibuprofen as needed Follow up with orthopedics if no improvement     ED Prescriptions     Medication Sig Dispense Auth. Provider   ibuprofen (ADVIL) 600 MG tablet Take 1 tablet (600 mg total) by mouth every 6 (six) hours as needed. 30 tablet Ward, Tylene Fantasia, PA-C      PDMP not reviewed this encounter.   Ward, Tylene Fantasia, PA-C 01/15/21 1722

## 2021-01-29 ENCOUNTER — Other Ambulatory Visit: Payer: Self-pay | Admitting: Family Medicine

## 2021-02-19 ENCOUNTER — Other Ambulatory Visit: Payer: Self-pay

## 2021-02-19 ENCOUNTER — Ambulatory Visit (INDEPENDENT_AMBULATORY_CARE_PROVIDER_SITE_OTHER): Payer: No Typology Code available for payment source | Admitting: Family Medicine

## 2021-02-19 ENCOUNTER — Encounter: Payer: Self-pay | Admitting: Family Medicine

## 2021-02-19 VITALS — BP 124/84 | HR 83 | Wt 178.0 lb

## 2021-02-19 DIAGNOSIS — E785 Hyperlipidemia, unspecified: Secondary | ICD-10-CM

## 2021-02-19 DIAGNOSIS — E1169 Type 2 diabetes mellitus with other specified complication: Secondary | ICD-10-CM | POA: Diagnosis not present

## 2021-02-19 DIAGNOSIS — E119 Type 2 diabetes mellitus without complications: Secondary | ICD-10-CM | POA: Diagnosis not present

## 2021-02-19 DIAGNOSIS — Z23 Encounter for immunization: Secondary | ICD-10-CM

## 2021-02-19 LAB — POCT GLYCOSYLATED HEMOGLOBIN (HGB A1C): HbA1c, POC (controlled diabetic range): 7.4 % — AB (ref 0.0–7.0)

## 2021-02-19 MED ORDER — OZEMPIC (2 MG/DOSE) 8 MG/3ML ~~LOC~~ SOPN: 2.0000 mg | PEN_INJECTOR | SUBCUTANEOUS | 1 refills | Status: DC

## 2021-02-19 MED ORDER — INSULIN GLARGINE 100 UNIT/ML ~~LOC~~ SOLN: 10.0000 [IU] | Freq: Every day | SUBCUTANEOUS | 3 refills | Status: DC

## 2021-02-19 NOTE — Progress Notes (Signed)
    SUBJECTIVE:   CHIEF COMPLAINT: diabetes check up  HPI:   Morgan Osborne is a 45 y.o. yo with history notable for type 2 DM, dyslipidemia, and  HTN presenting for check up. A vietnamese interpreter was used throughout encounter.   Patient reports compliance with diabetes medications. Current regimen is metformin, ozempic, jardiacne, and lantus 16  units. BG range from 115-150. Denies polyuria or polydipsia. She is very pleased with A1C and weight loss.   Patient reports stressors at home. Daughter has cardiopulmonary condition and her mother (age 1?) is ill as well. Reports fatigue, low mood. Denies SI/HI. Reports she has many people to talk with. No interested in medication. Appreciative of support and thinks she can 'manage' well.   PERTINENT  PMH / PSH/Family/Social History :  Also history of loss (grief)   OBJECTIVE:   BP 124/84   Pulse 83   Wt 178 lb (80.7 kg)   SpO2 100%   BMI 33.63 kg/m   Today's weight:  Last Weight  Most recent update: 02/19/2021  9:53 AM    Weight  80.7 kg (178 lb)            Review of prior weights: Filed Weights   02/19/21 0953  Weight: 178 lb (80.7 kg)     Cardiac: Regular rate and rhythm. Normal S1/S2. No murmurs, rubs, or gallops appreciated. Lungs: Clear bilaterally to ascultation. Psych: Pleasant and appropriate    ASSESSMENT/PLAN:   Hyperlipidemia associated with type 2 diabetes mellitus (HCC) Currently on statin therapy.   Diabetes mellitus without complication (HCC) Congratulated on A1C improvement. Decreased to lantus to 10 U. Increased semaglutide to maximal dose. BMP today. Discussed side effects and return precautions. Follow up scheduled to monitor for side effects consider further insulin dose reduction in future    HTN BP at goal  Adjustment disorder, offered resources, consider therapy at follow up. Patient has strong support system. Follow up scheduled to check on mood/adjustment. Consider referral for counseling/CCM  for caregiver resources at follow up   HCM Flu today COVID booster at follow up     Terisa Starr, MD  Family Medicine Teaching Service  [facility]

## 2021-02-19 NOTE — Assessment & Plan Note (Signed)
Congratulated on A1C improvement. Decreased to lantus to 10 U. Increased semaglutide to maximal dose. BMP today. Discussed side effects and return precautions. Follow up scheduled to monitor for side effects consider further insulin dose reduction in future

## 2021-02-19 NOTE — Patient Instructions (Addendum)
It was wonderful to see you today.  Please bring ALL of your medications with you to every visit.   Today we talked about:  - Your A1C is 7.4--great work  --I am worried about your stress. Please call if you feel very sad.  -- I would like to check in on your insulin in a few weeks  --- We will get blood work today   Thank you for choosing [facility].   Please call [phone] with any questions about today's appointment.  Please be sure to schedule follow up at the front  desk before you leave today.   Terisa Starr, MD  Family Medicine

## 2021-02-19 NOTE — Assessment & Plan Note (Signed)
Currently on statin therapy.

## 2021-02-20 LAB — BASIC METABOLIC PANEL
BUN/Creatinine Ratio: 20 (ref 9–23)
BUN: 14 mg/dL (ref 6–24)
CO2: 22 mmol/L (ref 20–29)
Calcium: 9.7 mg/dL (ref 8.7–10.2)
Chloride: 99 mmol/L (ref 96–106)
Creatinine, Ser: 0.71 mg/dL (ref 0.57–1.00)
Glucose: 127 mg/dL — ABNORMAL HIGH (ref 70–99)
Potassium: 4.5 mmol/L (ref 3.5–5.2)
Sodium: 137 mmol/L (ref 134–144)
eGFR: 105 mL/min/{1.73_m2} (ref >59)

## 2021-02-22 ENCOUNTER — Encounter: Payer: Self-pay | Admitting: Family Medicine

## 2021-03-09 ENCOUNTER — Encounter: Payer: Self-pay | Admitting: Family Medicine

## 2021-03-09 ENCOUNTER — Ambulatory Visit (INDEPENDENT_AMBULATORY_CARE_PROVIDER_SITE_OTHER): Payer: No Typology Code available for payment source | Admitting: Family Medicine

## 2021-03-09 ENCOUNTER — Other Ambulatory Visit: Payer: Self-pay

## 2021-03-09 VITALS — BP 110/73 | HR 78 | Ht 61.0 in | Wt 176.0 lb

## 2021-03-09 DIAGNOSIS — Z0100 Encounter for examination of eyes and vision without abnormal findings: Secondary | ICD-10-CM | POA: Diagnosis not present

## 2021-03-09 DIAGNOSIS — Z1211 Encounter for screening for malignant neoplasm of colon: Secondary | ICD-10-CM | POA: Diagnosis not present

## 2021-03-09 DIAGNOSIS — E119 Type 2 diabetes mellitus without complications: Secondary | ICD-10-CM | POA: Diagnosis not present

## 2021-03-09 DIAGNOSIS — E785 Hyperlipidemia, unspecified: Secondary | ICD-10-CM

## 2021-03-09 DIAGNOSIS — I10 Essential (primary) hypertension: Secondary | ICD-10-CM | POA: Diagnosis not present

## 2021-03-09 MED ORDER — ROSUVASTATIN CALCIUM 10 MG PO TABS: 10.0000 mg | ORAL_TABLET | Freq: Every day | ORAL | 1 refills | Status: DC

## 2021-03-09 MED ORDER — LOSARTAN POTASSIUM-HCTZ 50-12.5 MG PO TABS: 1.0000 | ORAL_TABLET | Freq: Every day | ORAL | 1 refills | Status: DC

## 2021-03-09 NOTE — Patient Instructions (Addendum)
Th?t vui khi g?p b?n hm nay!  Ti r?t vui v b?n ?ang c?m th?y t?t h?n. Ti ? ??t gi?y gi?i thi?u ??n bc s? nhn khoa v bc s? GI ?? ki?m tra n?i soi. Ti ? k ??n n?p crestor v losartan-HCTZ cho hi?u thu?c mong mu?n c?a b?n.  Vui lng theo di t?i cu?c h?n ? ln l?ch ti?p theo c?a b?n sau 2 thng n?a, n?u c b?t k? v?n ?? g pht sinh gi?a by gi? v sau ?, vui lng lin h? v?i v?n phng c?a chng ti.   C?m ?n b?n ? cho php chng ti tr? thnh m?t ph?n trong vi?c ch?m [facility] t? c?a b?n!  C?m ?n b?n, Ti?n s? Robyne Peers    It was great seeing you today!  I am glad that you are feeling better. I have placed a referral to the eye doctor and GI doctor for a colonoscopy screening. I have prescribed refills of crestor and losartan-HCTZ to your desired pharmacy.   Please follow up at your next scheduled appointment in 2 months, if anything arises between now and then, please don't hesitate to contact our office.   Thank you for allowing [place] to be a part of your medical care!  Thank you, Dr. Robyne Peers   Therapy and Counseling Resources Most providers on this list will take Medicaid. Patients with commercial insurance or Medicare should contact their insurance company to get a list of in network providers.  [facility]:Psychiatry and Counseling [address] [place], [place] [address] [phone]  [facility]  [address] [place], [place] [address]      [phone]  [facility] [address]  [place], [place] [address] [phone]  [facility] [address]  [place], [place] [address]       [phone]     [facility] (virtual only) [phone]  [facility]ov[facility]ussma[facility] [address], [place], [place] [phone]  [facility]:  [address] [place] [phone]  [facility]:  [address] Diener [phone]  [facility] (under & uninsured) [address]   [place] [phone]    [email or web address]    [facility] [address]  [place]    [phone]  [facility] -[address]     Phone:  [phone]     [facility]-  [address]  [phone]   [facility] [address]      [place], [place] [phone]  [facility]: [address] [place], [place], [place]  [phone]   [facility] (Spanish therapist) [email or web address]  [address] [place]  [address]   [place] [address]    [phone]    [facility]   [address],  [place], [place]  [phone]   [facility]  [address] [place]  [phone]  [facility]  [address] [place], [place]        [phone]  Open Access/Walk In Clinic under & uninsured  [facility]  [address] [place], [place] [phone] Crisis [phone]  [facility] [address],  (Spanish)   [address], [place]: [phone]) 8:30 - 12; 1 - 2:30  [facility],  [address] [place]    [phone]):8:30 - 12; 2 - 3PM  [facility] [employer],  [address],  [place]; [phone]):   Mon - Fri 8 AM - 5 PM  [facility] [address] [place]  MWF 12:30 to 3:00 or call to schedule an appointment  [phone]  Specific Provider options Psychology Today  [email or web address]/us click on find a therapist  enter your zip code left side and select or tailor a therapist for your specific need.   [facility] Provider Directory [email or web address]  (Medicaid)   Follow all drop down to find a provider  Social Support program [facility] [phone] or  [employer] [address], [place], [place] Recovery support and educational   24- Hour Availability:   [facility]  [address] [place], [place] [phone] Crisis [phone]  [facility] [phone]  [place] [facility]  [phone]   [facility]  [phone] (after hours)  Therapeutic Alternative/Mobile Crisis   [phone]  [place] National Suicide Hotline  [phone] Len Childs)  Call 911 or go to emergency room  [facility]  [phone]);  [place] and [employer]  [phone]); [place], [place], [place], [place], [place], [place], [place]

## 2021-03-09 NOTE — Progress Notes (Addendum)
    SUBJECTIVE:   CHIEF COMPLAINT / HPI:   Morgan Osborne (MRN: [id number]) is a 45 y.o. female with a history of T2DM, HLD, HTN, and adjustment disorder who presents for follow up of mood. A [place] interpreter was used for the entire encounter.  Adjustment disorder Patient states since her last appointment, "life has been better." She is still experiencing a lot of stress surrounding her caregiver role with her daughter, who has heart and lung problems, and her elderly mother, but she has been speaking with friends and participating in church activities to help cope. She feels like what she is doing now is working well for her, and she is still not interested in trying medication or therapy services at this time. She has not had thoughts of hurting herself or others.  T2DM Patient has been tolerating her increased semaglutide and decreased lantus well. Her fasting BG at home ranges 100-110. She denies any side effects with these medications. She is amenable to having an ophthalmology appointment as long as insurance will cover the visit.  HTN Patient is taking her losartan-HCTZ as prescribed. She recently ran out this morning and would like a refill. Denies chest pain and dyspnea.   HLD Patient is taking her rosuvastatin as prescribed. She also needs a refill for this medication.  OBJECTIVE:   BP 110/73   Pulse 78   Ht 5\' 1"  (1.549 m)   Wt 176 lb (79.8 kg)   SpO2 100%   BMI 33.25 kg/m    PHYSICAL EXAM  GEN: Well-developed, in NAD HEAD: NCAT CVS: RRR, normal S1/S2, no murmurs, rubs, gallops RESP: Breathing comfortably on RA, no retractions, wheezes, rhonchi, or crackles ABD: Soft, non-distended PSY: Mood and affect normal, tearful when discussing stressors and gratitude for medical team, thought content and process normal, no SI/HI EXT: Moves all extremities grossly equally, no LE edema noted bilaterally     ASSESSMENT/PLAN:   Adjustment disorder Patient is doing well with  friends and church activities as her support system. Given her improvement over time and desire to continue without medications or therapy, will continue strict follow up for mood symptoms. Apparent stressors as she serves as a caregiver for both her elderly mother and sick daughter. Limited PHQ-9 due to language barrier. Discussed contacting the office if she would like any medication or therapy in the future. Reassurance provided and encouraged to utilize support system.  T2DM Patient has been taking her medications as prescribed and reports no issues. A1c on 02/19/21 was 7.4. Continue current diabetic regimen. Referral to ophthalmology for diabetic eye exam placed. Discussed importance of annual eye exams.   HTN Patient's BP today is 110/73. She is tolerating her medications well. Continue current regimen. Refill for losartan-HCTZ sent.  HLD Patient's lipid panel on 11/16/20 was overall normal. She is tolerating her medication well. Continue current regimen. Refill for Crestor sent.  Health maintenance Patient deferred COVID booster today. She has had 4 doses total. She is amenable to colonoscopy. Referral to GI placed.  Follow up in 2 months for mood check or sooner if other concerns arise.  [place] video interpretation utilized throughout the entirety of this encounter.    01/17/21, Medical Student [facility]  Samantha Crimes, DO PGY-2 [facility]

## 2021-03-22 ENCOUNTER — Other Ambulatory Visit: Payer: Self-pay | Admitting: Family Medicine

## 2021-03-23 ENCOUNTER — Other Ambulatory Visit: Payer: Self-pay

## 2021-03-23 ENCOUNTER — Telehealth: Payer: Self-pay | Admitting: Family Medicine

## 2021-03-23 NOTE — Telephone Encounter (Signed)
Patient walked in with a note and card form [facility] pharmacy requesting testing supplies and pen needles please advise. Thanks!

## 2021-03-29 ENCOUNTER — Other Ambulatory Visit: Payer: Self-pay | Admitting: Family Medicine

## 2021-03-30 ENCOUNTER — Other Ambulatory Visit: Payer: Self-pay | Admitting: Family Medicine

## 2021-03-30 MED ORDER — BLOOD GLUCOSE METER KIT: PACK | 0 refills | Status: DC

## 2021-03-30 NOTE — Telephone Encounter (Signed)
Glucose meter and kit prescription sent to pharmacy  

## 2021-04-08 ENCOUNTER — Other Ambulatory Visit: Payer: Self-pay

## 2021-04-08 MED ORDER — METFORMIN HCL 1000 MG PO TABS: ORAL_TABLET | ORAL | 1 refills | Status: DC

## 2021-04-19 ENCOUNTER — Other Ambulatory Visit: Payer: Self-pay

## 2021-04-20 MED ORDER — OZEMPIC (2 MG/DOSE) 8 MG/3ML ~~LOC~~ SOPN
2.0000 mg | PEN_INJECTOR | SUBCUTANEOUS | 1 refills | Status: DC
Start: 2021-04-20 — End: 2021-06-11

## 2021-04-21 ENCOUNTER — Telehealth: Payer: Self-pay | Admitting: *Deleted

## 2021-04-21 NOTE — Telephone Encounter (Deleted)
Placed fax from pharmacy about this pts trazadone in your box for review.  It looks like maybe pt is taking different from the amt of refills that they have gotten.Orlando Devereux Zimmerman Rumple, CMA °

## 2021-04-22 NOTE — Telephone Encounter (Signed)
Opened in error.Amariss Detamore Zimmerman Rumple, CMA  

## 2021-06-10 ENCOUNTER — Other Ambulatory Visit: Payer: Self-pay | Admitting: Family Medicine

## 2021-06-11 ENCOUNTER — Other Ambulatory Visit: Payer: Self-pay

## 2021-06-11 MED ORDER — ACCU-CHEK SOFTCLIX LANCETS MISC: 12 refills | Status: DC

## 2021-06-11 MED ORDER — ACCU-CHEK GUIDE VI STRP
ORAL_STRIP | 12 refills | Status: DC
  Filled 2021-11-15: qty 100, 25d supply, fill #0
  Filled 2022-02-18: qty 100, 25d supply, fill #1

## 2021-06-11 NOTE — Telephone Encounter (Signed)
Pharmacy calls nurse line requesting new rx for testing strips and lancets. Patient has a accu-chek guide meter and needs accu-chek testing supplies.   Sent in supplies per protocol.   Veronda Prude, RN

## 2021-06-13 MED ORDER — OZEMPIC (2 MG/DOSE) 8 MG/3ML ~~LOC~~ SOPN
2.0000 mg | PEN_INJECTOR | SUBCUTANEOUS | 1 refills | Status: DC
  Filled 2021-07-02: qty 3, 28d supply, fill #0
  Filled 2021-07-26: qty 3, 28d supply, fill #1

## 2021-07-02 ENCOUNTER — Other Ambulatory Visit: Payer: Self-pay

## 2021-07-05 ENCOUNTER — Other Ambulatory Visit: Payer: Self-pay

## 2021-07-26 ENCOUNTER — Other Ambulatory Visit: Payer: Self-pay

## 2021-08-03 NOTE — Progress Notes (Signed)
Submitted application for JARDIANCE to [employer] ([employer]) for patient assistance.   Phone: [phone]  

## 2021-08-05 ENCOUNTER — Other Ambulatory Visit: Payer: Self-pay

## 2021-08-10 NOTE — Progress Notes (Signed)
Submitted application for OZEMPIC to [employer] for patient assistance.   Phone: [phone]  

## 2021-08-17 NOTE — Progress Notes (Addendum)
Received notification from [place] regarding approval for [place]. Patient assistance approved from 08/13/21 to 08/07/22. ? ?MEDICATION WILL SHIP TO OFFICE IN 3-4 WEEKS ? ?Phone: [phone] ? ?

## 2021-08-18 ENCOUNTER — Other Ambulatory Visit: Payer: Self-pay

## 2021-08-18 ENCOUNTER — Telehealth: Payer: Self-pay

## 2021-08-18 NOTE — Telephone Encounter (Signed)
Medication given to the patient.  ?

## 2021-08-18 NOTE — Telephone Encounter (Signed)
Informed pt her novo nordisk medication is ready for pickup. ? ?Ozempic labeled and ready in med room fridge ?

## 2021-08-31 ENCOUNTER — Other Ambulatory Visit: Payer: Self-pay

## 2021-08-31 ENCOUNTER — Telehealth: Payer: Self-pay | Admitting: Family Medicine

## 2021-08-31 DIAGNOSIS — I10 Essential (primary) hypertension: Secondary | ICD-10-CM

## 2021-08-31 MED ORDER — LOSARTAN POTASSIUM-HCTZ 50-12.5 MG PO TABS
1.0000 | ORAL_TABLET | Freq: Every day | ORAL | 1 refills | Status: DC
  Filled 2021-09-02: qty 30, 30d supply, fill #0
  Filled 2021-09-29: qty 30, 30d supply, fill #1
  Filled 2021-10-26: qty 30, 30d supply, fill #2
  Filled 2021-11-30: qty 90, 90d supply, fill #3

## 2021-08-31 MED ORDER — EMPAGLIFLOZIN 10 MG PO TABS
10.0000 mg | ORAL_TABLET | Freq: Every day | ORAL | 3 refills | Status: DC
  Filled 2021-09-02: qty 30, 30d supply, fill #0
  Filled 2021-10-26: qty 30, 30d supply, fill #1

## 2021-08-31 NOTE — Telephone Encounter (Signed)
Patient came in stating that she needs refills for her Losartan and Jardiance. ?

## 2021-09-02 ENCOUNTER — Other Ambulatory Visit: Payer: Self-pay

## 2021-09-06 NOTE — Progress Notes (Signed)
Received notification from [employer]) regarding approval for [employer]. Patient assistance approved from 09/03/21 to 09/03/22. ? ?MEDICATION WILL SHIP TO PT'S HOME. MEDICATION WILL NOT AUTO REFILL, PT CAN CALL THEIR OWN REFILLS TO COMPANY ? ?Phone: [phone] ? ?

## 2021-09-22 ENCOUNTER — Ambulatory Visit (INDEPENDENT_AMBULATORY_CARE_PROVIDER_SITE_OTHER): Payer: BC Managed Care – PPO | Admitting: Student

## 2021-09-22 ENCOUNTER — Encounter: Payer: Self-pay | Admitting: Student

## 2021-09-22 VITALS — BP 128/84 | HR 74 | Ht 61.0 in | Wt 173.0 lb

## 2021-09-22 DIAGNOSIS — Z975 Presence of (intrauterine) contraceptive device: Secondary | ICD-10-CM | POA: Insufficient documentation

## 2021-09-22 DIAGNOSIS — E119 Type 2 diabetes mellitus without complications: Secondary | ICD-10-CM | POA: Diagnosis not present

## 2021-09-22 DIAGNOSIS — N921 Excessive and frequent menstruation with irregular cycle: Secondary | ICD-10-CM | POA: Diagnosis not present

## 2021-09-22 DIAGNOSIS — R011 Cardiac murmur, unspecified: Secondary | ICD-10-CM | POA: Diagnosis not present

## 2021-09-22 DIAGNOSIS — I10 Essential (primary) hypertension: Secondary | ICD-10-CM

## 2021-09-22 LAB — POCT GLYCOSYLATED HEMOGLOBIN (HGB A1C): HbA1c, POC (controlled diabetic range): 7.5 % — AB (ref 0.0–7.0)

## 2021-09-22 NOTE — Assessment & Plan Note (Addendum)
A1c 7.5.  Continue Ozempic 2 mg weekly, metformin 1000 mg twice daily, Lantus 10 units daily, Jardiance 10 mg daily.  Recheck A1c in 6 months.  Referral to ophthalmology for diabetic eye exam. ?

## 2021-09-22 NOTE — Assessment & Plan Note (Signed)
Unable to find history of murmur upon chart review.  Patient states she is aware she may have murmur from valve problems when she was seen in another hospital but could not provide further details.  Echocardiogram scheduled. ?

## 2021-09-22 NOTE — Patient Instructions (Addendum)
It was great to see you today! Thank you for choosing [facility] for your primary care. Morgan Osborne was seen for diabetes and close follow-up. ? ?Today we addressed: ?Diabetes: No change doing today.  I referred you to an ophthalmologist as it is important to get your eyes evaluated when you have diabetes. ?Hypertension: Your blood pressure looks great.  No changes today. ?Vaginal bleeding: It is likely that you are uterine lining buildup while being on Nexplanon and you had breakthrough bleeding.  This can be common and we can just follow for now. ?Murmur: I do not have any records about you having a murmur.  This should be further evaluated and I would like to get an echocardiogram on you.  This is an ultrasound of your heart that we will be able to tell us if this murmur is of any concern. ? ? ?Orders Placed This Encounter  ?Procedures  ? Ambulatory referral to Ophthalmology  ?  Referral Priority:   Routine  ?  Referral Type:   Consultation  ?  Referral Reason:   Specialty Services Required  ?  Requested Specialty:   Ophthalmology  ?  Number of Visits Requested:   1  ? POCT glycosylated hemoglobin (Hb A1C)  ? ECHOCARDIOGRAM COMPLETE  ?  Standing Status:   Future  ?  Standing Expiration Date:   09/23/2022  ?  Order Specific Question:   Where should this test be performed  ?  Answer:   [place]  ?  Order Specific Question:   Perflutren DEFINITY (image enhancing agent) should be administered unless hypersensitivity or allergy exist  ?  Answer:   Administer Perflutren  ?  Order Specific Question:   Reason for exam-Echo  ?  Answer:   Murmur R01.1  ? ?You should return to our clinic Return in about 6 months (around 03/25/2022) for annual exam.. ? ?I recommend that you always bring your medications to each appointment as this makes it easy to ensure you are on the correct medications and helps [place] not miss refills when you need them. ? ?Please arrive 15 minutes before your appointment to ensure smooth check in  process.  We appreciate your efforts in making this happen. ? ?Take care and seek immediate care sooner if you develop any concerns.  ? ?Thank you for allowing me to participate in your care, ?Shelby Mattocks, DO ?09/22/2021, 4:34 PM ?PGY-1,  Family Medicine ?  ?

## 2021-09-22 NOTE — Progress Notes (Signed)
  SUBJECTIVE:   CHIEF COMPLAINT / HPI:   T2DM: A1c 7.5. Her blood sugar at home 110s-140s. She is still tolerating ozempic well but does have nausea every now and then but that does go away. She does sometimes have diarrhea or vomiting. This happens once every couple of months.   HTN: she is taking Hyzaar 50-12.5mg  once daily.  Period concerns: she states she had a period for an entire month. Pt has Nexplanon in place (about 1 year ago). She was not having periods for 9 months and then in March she began having spotting every day until April.   iPad [place] interpreter present  PERTINENT  PMH / PSH: T2DM, HTN, HLD  Past Medical History:  Diagnosis Date   Diabetes mellitus without complication (HCC)    OBJECTIVE:  BP 128/84   Pulse 74   Ht 5\' 1"  (1.549 m)   Wt 173 lb (78.5 kg)   SpO2 97%   BMI 32.69 kg/m   General: NAD, pleasant, able to participate in exam Cardiac: RRR, 2/6 systolic murmur at LUSB Respiratory: CTAB, normal effort, no wheezes, rales or rhonchi Abdomen: soft, non-tender, non-distended, normoactive bowel sounds  ASSESSMENT/PLAN:  Breakthrough bleeding on Nexplanon Resolved currently, will monitor.  Murmur, cardiac Unable to find history of murmur upon chart review.  Patient states she is aware she may have murmur from valve problems when she was seen in another hospital but could not provide further details.  Echocardiogram scheduled.  Diabetes mellitus without complication (HCC) A1c 7.5.  Continue Ozempic 2 mg weekly, metformin 1000 mg twice daily, Lantus 10 units daily, Jardiance 10 mg daily.  Recheck A1c in 6 months.  Referral to ophthalmology for diabetic eye exam.  Essential hypertension Blood pressure well controlled, 128/84 today.  Continue Hyzaar 50-12.5 mg daily.   Orders Placed This Encounter  Procedures   Ambulatory referral to Ophthalmology    Referral Priority:   Routine    Referral Type:   Consultation    Referral Reason:   Specialty  Services Required    Requested Specialty:   Ophthalmology    Number of Visits Requested:   1   POCT glycosylated hemoglobin (Hb A1C)   ECHOCARDIOGRAM COMPLETE    Standing Status:   Future    Standing Expiration Date:   09/23/2022    Order Specific Question:   Where should this test be performed    Answer:   [place]    Order Specific Question:   Perflutren DEFINITY (image enhancing agent) should be administered unless hypersensitivity or allergy exist    Answer:   Administer Perflutren    Order Specific Question:   Reason for exam-Echo    Answer:   Murmur R01.1   Return in about 6 months (around 03/25/2022) for annual exam. 03/27/2022, DO 09/22/2021, 5:50 PM PGY-1, [facility]

## 2021-09-22 NOTE — Assessment & Plan Note (Signed)
Resolved currently, will monitor. ?

## 2021-09-22 NOTE — Assessment & Plan Note (Signed)
Blood pressure well controlled, 128/84 today.  Continue Hyzaar 50-12.5 mg daily. ?

## 2021-09-29 ENCOUNTER — Other Ambulatory Visit: Payer: Self-pay

## 2021-09-29 ENCOUNTER — Telehealth: Payer: Self-pay | Admitting: Family Medicine

## 2021-09-29 NOTE — Telephone Encounter (Signed)
Patient came in stating that she needs refills on her Rosuvastatin and Metformin. She is almost out. If possible she would like it sent to [facility].

## 2021-09-30 ENCOUNTER — Telehealth: Payer: Self-pay | Admitting: Family Medicine

## 2021-09-30 NOTE — Telephone Encounter (Signed)
Will forward to MD to please send all medications to the updated pharmacy.  Thanks [employer]

## 2021-09-30 NOTE — Telephone Encounter (Signed)
Patient came in stating that she needs her prescriptions sent to the [facility]  on [address] instead of the [facility]. If possible, she would also like to be notified when they get sent in.

## 2021-10-01 ENCOUNTER — Other Ambulatory Visit: Payer: Self-pay | Admitting: Family Medicine

## 2021-10-01 ENCOUNTER — Other Ambulatory Visit (HOSPITAL_COMMUNITY): Payer: Self-pay

## 2021-10-01 DIAGNOSIS — E785 Hyperlipidemia, unspecified: Secondary | ICD-10-CM

## 2021-10-01 MED ORDER — METFORMIN HCL 1000 MG PO TABS
1000.0000 mg | ORAL_TABLET | Freq: Two times a day (BID) | ORAL | 1 refills | Status: DC
  Filled 2021-10-01: qty 180, 90d supply, fill #0
  Filled 2021-10-26: qty 60, 30d supply, fill #0
  Filled 2022-04-29 (×2): qty 60, 30d supply, fill #1
  Filled 2022-05-24: qty 60, 30d supply, fill #2
  Filled 2022-06-29: qty 60, 30d supply, fill #3
  Filled 2022-08-15 (×2): qty 60, 30d supply, fill #4
  Filled 2022-09-15: qty 60, 30d supply, fill #5

## 2021-10-01 MED ORDER — METFORMIN HCL 1000 MG PO TABS: ORAL_TABLET | ORAL | 1 refills | Status: DC

## 2021-10-01 MED ORDER — ROSUVASTATIN CALCIUM 10 MG PO TABS
10.0000 mg | ORAL_TABLET | Freq: Every day | ORAL | 1 refills | Status: DC
  Filled 2021-10-01: qty 90, 90d supply, fill #0
  Filled 2021-10-26 (×2): qty 30, 30d supply, fill #0

## 2021-10-01 MED ORDER — ROSUVASTATIN CALCIUM 10 MG PO TABS: 10.0000 mg | ORAL_TABLET | Freq: Every day | ORAL | 1 refills | Status: DC

## 2021-10-01 NOTE — Telephone Encounter (Signed)
Pharmacy will call patient when meds are ready for pick up. Uyen Eichholz,CMA

## 2021-10-01 NOTE — Telephone Encounter (Signed)
Sent meds to [facility]. Pls inform pt thanks

## 2021-10-13 ENCOUNTER — Other Ambulatory Visit (HOSPITAL_COMMUNITY): Payer: Self-pay

## 2021-10-13 ENCOUNTER — Ambulatory Visit (HOSPITAL_COMMUNITY): Payer: BC Managed Care – PPO

## 2021-10-19 ENCOUNTER — Ambulatory Visit (HOSPITAL_COMMUNITY)
Admission: RE | Admit: 2021-10-19 | Discharge: 2021-10-19 | Disposition: A | Discharge status: Home / Self Care | Payer: BC Managed Care – PPO | Source: Ambulatory Visit | Attending: Family Medicine | Admitting: Family Medicine

## 2021-10-19 DIAGNOSIS — I10 Essential (primary) hypertension: Secondary | ICD-10-CM | POA: Insufficient documentation

## 2021-10-19 DIAGNOSIS — E119 Type 2 diabetes mellitus without complications: Secondary | ICD-10-CM | POA: Diagnosis not present

## 2021-10-19 DIAGNOSIS — E785 Hyperlipidemia, unspecified: Secondary | ICD-10-CM | POA: Diagnosis not present

## 2021-10-19 DIAGNOSIS — R011 Cardiac murmur, unspecified: Secondary | ICD-10-CM

## 2021-10-19 LAB — ECHOCARDIOGRAM COMPLETE
Area-P 1/2: 4.52 cm2
S' Lateral: 3 cm

## 2021-10-19 NOTE — Progress Notes (Signed)
  Echocardiogram 2D Echocardiogram has been performed.  Devery Murgia 10/19/2021, 11:25 AM

## 2021-10-21 ENCOUNTER — Encounter: Payer: Self-pay | Admitting: Student

## 2021-10-26 ENCOUNTER — Other Ambulatory Visit: Payer: Self-pay

## 2021-10-26 ENCOUNTER — Other Ambulatory Visit: Payer: Self-pay | Admitting: Family Medicine

## 2021-10-27 ENCOUNTER — Other Ambulatory Visit: Payer: Self-pay | Admitting: Family Medicine

## 2021-10-27 ENCOUNTER — Other Ambulatory Visit: Payer: Self-pay

## 2021-10-27 MED ORDER — INSULIN PEN NEEDLE 31G X 5 MM MISC
1 refills | Status: DC
  Filled 2021-10-27: qty 100, 100d supply, fill #0
  Filled 2022-05-04: qty 100, 90d supply, fill #0

## 2021-10-27 MED ORDER — OZEMPIC (2 MG/DOSE) 8 MG/3ML ~~LOC~~ SOPN
2.0000 mg | PEN_INJECTOR | SUBCUTANEOUS | 1 refills | Status: DC
  Filled 2021-10-27 (×2): qty 3, 28d supply, fill #0

## 2021-10-29 ENCOUNTER — Other Ambulatory Visit: Payer: Self-pay

## 2021-10-29 MED ORDER — INSULIN GLARGINE 100 UNIT/ML ~~LOC~~ SOLN
10.0000 [IU] | Freq: Every day | SUBCUTANEOUS | 3 refills | Status: DC
  Filled 2021-10-29: qty 10, 100d supply, fill #0
  Filled 2021-11-01 (×2): qty 10, 28d supply, fill #0

## 2021-11-01 ENCOUNTER — Other Ambulatory Visit: Payer: Self-pay

## 2021-11-02 ENCOUNTER — Telehealth: Payer: Self-pay

## 2021-11-02 ENCOUNTER — Other Ambulatory Visit: Payer: Self-pay | Admitting: Family Medicine

## 2021-11-02 ENCOUNTER — Other Ambulatory Visit: Payer: Self-pay

## 2021-11-02 MED ORDER — BASAGLAR KWIKPEN 100 UNIT/ML ~~LOC~~ SOPN
10.0000 [IU] | PEN_INJECTOR | Freq: Every day | SUBCUTANEOUS | 0 refills | Status: DC
Start: 2021-11-02 — End: 2021-11-12

## 2021-11-02 NOTE — Telephone Encounter (Signed)
Just to clarify do I need to change the medication to basiglar?

## 2021-11-02 NOTE — Telephone Encounter (Signed)
Sent in meds thank you.

## 2021-11-03 ENCOUNTER — Encounter: Payer: Self-pay | Admitting: Family Medicine

## 2021-11-03 ENCOUNTER — Other Ambulatory Visit: Payer: Self-pay

## 2021-11-05 ENCOUNTER — Other Ambulatory Visit: Payer: Self-pay | Admitting: Family Medicine

## 2021-11-05 ENCOUNTER — Other Ambulatory Visit: Payer: Self-pay

## 2021-11-05 ENCOUNTER — Telehealth: Payer: Self-pay

## 2021-11-05 NOTE — Telephone Encounter (Signed)
[facility] [address] requesting a medication change.   Lantus is unaffordable for patient. Please send in Basaglar if this is an appropriate option.   Will forward to PCP.

## 2021-11-08 ENCOUNTER — Other Ambulatory Visit: Payer: Self-pay

## 2021-11-12 ENCOUNTER — Other Ambulatory Visit: Payer: Self-pay | Admitting: Family Medicine

## 2021-11-12 MED ORDER — BASAGLAR KWIKPEN 100 UNIT/ML ~~LOC~~ SOPN
10.0000 [IU] | PEN_INJECTOR | Freq: Every day | SUBCUTANEOUS | 2 refills | Status: DC
  Filled 2021-11-15: qty 3, 30d supply, fill #0

## 2021-11-12 NOTE — Telephone Encounter (Signed)
Ordered refill for 30 days plus 2 refills. Thank you.

## 2021-11-15 ENCOUNTER — Other Ambulatory Visit: Payer: Self-pay

## 2021-11-15 MED ORDER — METFORMIN HCL 1000 MG PO TABS
ORAL_TABLET | ORAL | 0 refills | Status: DC
  Filled 2021-11-15: qty 180, 90d supply, fill #0

## 2021-11-15 MED ORDER — ROSUVASTATIN CALCIUM 10 MG PO TABS
ORAL_TABLET | ORAL | 0 refills | Status: DC
  Filled 2021-11-15: qty 90, 90d supply, fill #0

## 2021-11-16 ENCOUNTER — Other Ambulatory Visit: Payer: Self-pay

## 2021-11-16 MED ORDER — BASAGLAR KWIKPEN 100 UNIT/ML ~~LOC~~ SOPN: PEN_INJECTOR | SUBCUTANEOUS | 0 refills | Status: DC

## 2021-11-16 MED ORDER — ACCU-CHEK SOFTCLIX LANCETS MISC
0 refills | Status: DC
  Filled 2022-02-18: qty 100, 50d supply, fill #0

## 2021-11-16 MED ORDER — ONETOUCH DELICA LANCETS 33G MISC: 0 refills | Status: DC

## 2021-11-16 MED ORDER — GLUCOSE BLOOD VI STRP: ORAL_STRIP | 0 refills | Status: DC

## 2021-11-30 ENCOUNTER — Telehealth: Payer: Self-pay | Admitting: Family Medicine

## 2021-11-30 ENCOUNTER — Other Ambulatory Visit: Payer: Self-pay

## 2021-11-30 ENCOUNTER — Other Ambulatory Visit: Payer: Self-pay | Admitting: Family Medicine

## 2021-11-30 MED ORDER — EMPAGLIFLOZIN 10 MG PO TABS
10.0000 mg | ORAL_TABLET | Freq: Every day | ORAL | 3 refills | Status: DC
  Filled 2021-11-30: qty 90, 90d supply, fill #0

## 2021-11-30 MED ORDER — OZEMPIC (2 MG/DOSE) 8 MG/3ML ~~LOC~~ SOPN
2.0000 mg | PEN_INJECTOR | SUBCUTANEOUS | 1 refills | Status: DC
  Filled 2021-11-30: qty 3, 28d supply, fill #0

## 2021-11-30 MED ORDER — INSULIN GLARGINE 100 UNIT/ML ~~LOC~~ SOLN
10.0000 [IU] | Freq: Every day | SUBCUTANEOUS | 3 refills | Status: DC
  Filled 2021-11-30: qty 10, 100d supply, fill #0

## 2021-11-30 NOTE — Telephone Encounter (Signed)
Orders placed.

## 2021-11-30 NOTE — Telephone Encounter (Signed)
Patient walked in wanting doctor to know that the  [place] causes her to go to bathroom several times a day. Lantus works best for her. Also need refill on Ozempic and Jardiance

## 2021-12-01 ENCOUNTER — Telehealth: Payer: Self-pay

## 2021-12-01 ENCOUNTER — Other Ambulatory Visit: Payer: Self-pay

## 2021-12-01 ENCOUNTER — Other Ambulatory Visit (HOSPITAL_COMMUNITY): Payer: Self-pay

## 2021-12-01 NOTE — Telephone Encounter (Signed)
Informed pt 4 boxes of ozempic pens are ready for pickup  at the office.

## 2021-12-02 NOTE — Telephone Encounter (Signed)
Patient picked up. Jone Baseman, CMA

## 2022-01-05 ENCOUNTER — Other Ambulatory Visit: Payer: Self-pay | Admitting: *Deleted

## 2022-01-05 MED ORDER — INSULIN GLARGINE 100 UNIT/ML ~~LOC~~ SOLN
10.0000 [IU] | Freq: Every day | SUBCUTANEOUS | 0 refills | Status: DC
Start: 2022-01-05 — End: 2022-02-03

## 2022-01-06 ENCOUNTER — Other Ambulatory Visit (HOSPITAL_COMMUNITY): Payer: Self-pay

## 2022-01-26 ENCOUNTER — Encounter (HOSPITAL_COMMUNITY): Payer: Self-pay | Admitting: Emergency Medicine

## 2022-01-26 ENCOUNTER — Ambulatory Visit (HOSPITAL_COMMUNITY)
Admission: EM | Admit: 2022-01-26 | Discharge: 2022-01-26 | Disposition: A | Discharge status: Home / Self Care | Payer: BC Managed Care – PPO | Attending: Internal Medicine | Admitting: Internal Medicine

## 2022-01-26 ENCOUNTER — Telehealth: Payer: Self-pay

## 2022-01-26 DIAGNOSIS — H0015 Chalazion left lower eyelid: Secondary | ICD-10-CM

## 2022-01-26 MED ORDER — ERYTHROMYCIN 5 MG/GM OP OINT: TOPICAL_OINTMENT | OPHTHALMIC | 0 refills | Status: DC

## 2022-01-26 NOTE — ED Triage Notes (Signed)
Pt reports left eye irration and stye x 7 days. Reports mild pain. Denies vision problems.

## 2022-01-26 NOTE — ED Provider Notes (Signed)
[facility]    CSN: [id number] Arrival date & time: 01/26/22  1359      History   Chief Complaint Chief Complaint  Patient presents with   Eye Irritation    HPI Morgan Osborne is a 46 y.o. female.   Patient presents to urgent care for evaluation of left eye pain for the last 7 days with associated "bump" to the middle of her lower eyelid on the left side.  Denies blurry vision, decreased visual acuity, headache, fever/chills, contact lens use, and dizziness.  No URI symptoms reported.  Patient states that she has had increased crying over the last few weeks due to her daughter being in the intensive care unit at [facility].  She is a diabetic and does not receive yearly eye exams.  States that her blood sugars are normally very well controlled with use of medicine.  Denies trauma/injury to the left eye.  States that she sometimes has a little bit of blurry vision in the morning but attributes this to the eye drainage that is thick and "gunky".  She has not attempted use of any over-the-counter medications prior to arrival urgent care for symptoms.  Currently denies eye pain.     Past Medical History:  Diagnosis Date   Diabetes mellitus without complication [facility])     Patient Active Problem List   Diagnosis Date Noted   Murmur, cardiac 09/22/2021   Essential hypertension 09/22/2021   Breakthrough bleeding on Nexplanon 09/22/2021   Nexplanon in place 09/22/2021   Hyperlipidemia associated with type 2 diabetes mellitus ([facility]) 11/13/2020   GERD (gastroesophageal reflux disease) 08/13/2020   Diabetes mellitus without complication ([place]) 38/33/3832   Birth control counseling 11/25/2019    History reviewed. No pertinent surgical history.  OB History   No obstetric history on file.      Home Medications    Prior to Admission medications   Medication Sig Start Date End Date Taking? Authorizing Provider  erythromycin ophthalmic ointment Place a 1/2 inch ribbon of ointment  into the lower eyelid. 01/26/22  Yes Talbot Grumbling, FNP  Accu-Chek Softclix Lancets lancets Please use to check blood sugar levels two times daily. E11.9 06/11/21   Lattie Haw, MD  Accu-Chek Softclix Lancets lancets use to check glucose twice daily 11/16/21   Lattie Haw, MD  blood glucose meter kit and supplies Dispense based on patient and insurance preference. Use two times daily to monitor blood glucose levels 03/30/21   Simmons-Robinson, Makiera, MD  CAL-GEST ANTACID 500 MG chewable tablet CHEW 1 TABLET (200 MG OF ELEMENTAL CALCIUM TOTAL) BY MOUTH DAILY. 12/29/20   Lattie Haw, MD  empagliflozin (JARDIANCE) 10 MG TABS tablet Take 1 tablet (10 mg total) by mouth daily. 11/30/21   Sharion Settler, DO  glucose blood (ACCU-CHEK GUIDE) test strip Please use to check blood sugar levels two times daily. E11.9 06/11/21   Lattie Haw, MD  glucose blood test strip use to check blood glucose twice daily to monitor levels 11/16/21   Lattie Haw, MD  insulin glargine (LANTUS) 100 UNIT/ML injection Inject 0.1 mLs (10 Units total) into the skin daily. You are overdue for an appointment. Please schedule for additional refills. 01/05/22   Sharion Settler, DO  Insulin Pen Needle 29G X 12.7MM MISC Use with ozempic 10/27/21   Lattie Haw, MD  losartan-hydrochlorothiazide (HYZAAR) 50-12.5 MG tablet Take 1 tablet by mouth daily. 08/31/21   Lattie Haw, MD  metFORMIN (GLUCOPHAGE) 1000 MG tablet Take 1 tablet (1,000 mg  total) by mouth 2 (two) times daily. 10/01/21   Lattie Haw, MD  metFORMIN (GLUCOPHAGE) 1000 MG tablet take 1 tablet by mouth twice a day 10/01/21     nystatin cream (MYCOSTATIN) APPLY 1 APPLICATION TOPICALLY 4 TIMES DAILY FOR 14 DAYS. **APPLY TO RASH 4 TIMES DAILY FOR 2 WEEKS** Patient not taking: Reported on 09/22/2021 03/23/21   Carollee Leitz, MD  OneTouch Delica Lancets 29U MISC use to check blood glucose twice daily to monitor levels 11/16/21   Lattie Haw, MD  rosuvastatin  (CRESTOR) 10 MG tablet Take 1 tablet (10 mg total) by mouth daily. 10/01/21   Lattie Haw, MD  rosuvastatin (CRESTOR) 10 MG tablet take 1 tablet by mouth once a  day 10/01/21     Semaglutide, 2 MG/DOSE, (OZEMPIC, 2 MG/DOSE,) 8 MG/3ML SOPN Inject 2 mg into the skin once a week. 11/30/21   Sharion Settler, DO    Family History Family History  Family history unknown: Yes    Social History Social History   Tobacco Use   Smoking status: Never   Smokeless tobacco: Never  Substance Use Topics   Alcohol use: No   Drug use: No     Allergies   Patient has no known allergies.   Review of Systems Review of Systems Per HPI  Physical Exam Triage Vital Signs ED Triage Vitals  Enc Vitals Group     BP 01/26/22 1543 120/76     Pulse Rate 01/26/22 1543 63     Resp 01/26/22 1543 16     Temp 01/26/22 1543 98 F (36.7 C)     Temp Source 01/26/22 1543 Oral     SpO2 01/26/22 1543 100 %     Weight --      Height --      Head Circumference --      Peak Flow --      Pain Score 01/26/22 1542 2     Pain Loc --      Pain Edu? --      Excl. in [place]? --    No data found.  Updated Vital Signs BP 120/76 (BP Location: Right Arm)   Pulse 63   Temp 98 F (36.7 C) (Oral)   Resp 16   SpO2 100%   Visual Acuity Right Eye Distance:   Left Eye Distance:   Bilateral Distance:    Right Eye Near:   Left Eye Near:    Bilateral Near:     Physical Exam Vitals and nursing note reviewed.  Constitutional:      Appearance: Normal appearance. She is not ill-appearing or toxic-appearing.     Comments: Very pleasant patient sitting on exam in position of comfort table in no acute distress.   HENT:     Head: Normocephalic and atraumatic.     Right Ear: Hearing and external ear normal.     Left Ear: Hearing and external ear normal.     Nose: Nose normal.     Mouth/Throat:     Lips: Pink.     Mouth: Mucous membranes are moist.  Eyes:     General: Lids are normal. Vision grossly intact. Gaze  aligned appropriately.     Extraocular Movements: Extraocular movements intact.     Conjunctiva/sclera: Conjunctivae normal.     Comments: EOMs intact without dizziness or pain elicited.  There is a small chalazion present to the medial aspect of the lower left eyelid.  No ecchymosis, warmth, erythema, or preseptal cellulitis present.  Pulmonary:  Effort: Pulmonary effort is normal.  Abdominal:     Palpations: Abdomen is soft.  Musculoskeletal:     Cervical back: Neck supple.  Skin:    General: Skin is warm and dry.     Capillary Refill: Capillary refill takes less than 2 seconds.     Findings: No rash.  Neurological:     General: No focal deficit present.     Mental Status: She is alert and oriented to person, place, and time. Mental status is at baseline.     Cranial Nerves: No dysarthria or facial asymmetry.     Gait: Gait is intact.  Psychiatric:        Mood and Affect: Mood normal.        Speech: Speech normal.        Behavior: Behavior normal.        Thought Content: Thought content normal.        Judgment: Judgment normal.         UC Treatments / Results  Labs (all labs ordered are listed, but only abnormal results are displayed) Labs Reviewed - No data to display  EKG   Radiology No results found.  Procedures Procedures (including critical care time)  Medications Ordered in UC Medications - No data to display  Initial Impression / Assessment and Plan / UC Course  I have reviewed the triage vital signs and the nursing notes.  Pertinent labs & imaging results that were available during my care of the patient were reviewed by me and considered in my medical decision making (see chart for details).   1.  Chalazion of left lower eyelid Erythromycin eye ointment to be used twice daily for the next 7 days prescribed.  Advised patient to follow-up with outpatient ophthalmology for reevaluation and further assessment of lower eyelid chalazion.  Advise warm  compresses to reduce pain and irritation.  She may take Tylenol every 6 hours as needed for discomfort and pain to the left eye.  Patient verbalizes understanding and agreement with plan.   Discussed physical exam and available lab work findings in clinic with patient.  Counseled patient regarding appropriate use of medications and potential side effects for all medications recommended or prescribed today. Discussed red flag signs and symptoms of worsening condition,when to call the PCP office, return to urgent care, and when to seek higher level of care in the emergency department. Patient verbalizes understanding and agreement with plan. All questions answered. Patient discharged in stable condition.  Final Clinical Impressions(s) / UC Diagnoses   Final diagnoses:  Chalazion of left lower eyelid     Discharge Instructions      Place a thin line of erythromycin eye ointment to the lower lid of your left eye twice daily for the next 7 days to treat infection.   Call the eye doctor listed on your paperwork to schedule an appointment to be seen for further evaluation.   If you develop any new or worsening symptoms or do not improve in the next 2 to 3 days, please return.  If your symptoms are severe, please go to the emergency room.  Follow-up with your primary care provider for further evaluation and management of your symptoms as well as ongoing wellness visits.  I hope you feel better!      ED Prescriptions     Medication Sig Dispense Auth. Provider   erythromycin ophthalmic ointment Place a 1/2 inch ribbon of ointment into the lower eyelid. 3.5 g Joella Prince M,  FNP      PDMP not reviewed this encounter.   Stanhope, Catharine M, FNP 01/26/22 1653  

## 2022-01-26 NOTE — Discharge Instructions (Addendum)
Place a thin line of erythromycin eye ointment to the lower lid of your left eye twice daily for the next 7 days to treat infection.   Call the eye doctor listed on your paperwork to schedule an appointment to be seen for further evaluation.   If you develop any new or worsening symptoms or do not improve in the next 2 to 3 days, please return.  If your symptoms are severe, please go to the emergency room.  Follow-up with your primary care provider for further evaluation and management of your symptoms as well as ongoing wellness visits.  I hope you feel better!

## 2022-01-26 NOTE — Telephone Encounter (Signed)
Patients daughter calls nurse line reporting right eye problems.   Daughter reports for the past ~ 1.5 weeks her right eye has been draining and very red. Today she noticed a painful "white bump" on her lower eye lid. Reports swelling and vision changes in that eye. Denies fevers.   Patient advised to go to [facility] for evaluation.  They agreed with plan.

## 2022-02-03 ENCOUNTER — Other Ambulatory Visit: Payer: Self-pay

## 2022-02-03 ENCOUNTER — Other Ambulatory Visit (HOSPITAL_COMMUNITY): Payer: Self-pay

## 2022-02-03 ENCOUNTER — Encounter: Payer: Self-pay | Admitting: Family Medicine

## 2022-02-03 ENCOUNTER — Ambulatory Visit (INDEPENDENT_AMBULATORY_CARE_PROVIDER_SITE_OTHER): Payer: BC Managed Care – PPO | Admitting: Family Medicine

## 2022-02-03 VITALS — BP 126/82 | HR 85 | Wt 170.4 lb

## 2022-02-03 DIAGNOSIS — Z23 Encounter for immunization: Secondary | ICD-10-CM

## 2022-02-03 DIAGNOSIS — E119 Type 2 diabetes mellitus without complications: Secondary | ICD-10-CM | POA: Diagnosis not present

## 2022-02-03 DIAGNOSIS — I10 Essential (primary) hypertension: Secondary | ICD-10-CM | POA: Diagnosis not present

## 2022-02-03 LAB — POCT GLYCOSYLATED HEMOGLOBIN (HGB A1C): HbA1c, POC (controlled diabetic range): 7.4 % — AB (ref 0.0–7.0)

## 2022-02-03 MED ORDER — INSULIN GLARGINE 100 UNITS/ML SOLOSTAR PEN
10.0000 [IU] | PEN_INJECTOR | Freq: Every day | SUBCUTANEOUS | 11 refills | Status: DC
  Filled 2022-02-03: qty 15, 150d supply, fill #0
  Filled 2022-02-04: qty 3, 30d supply, fill #0

## 2022-02-03 NOTE — Patient Instructions (Addendum)
It was great seeing you today!  Great job in staying on top of your health! Your A1c is down slightly from last visit at 7.4. Continue current medications, watching your diet and exercising .   Today you also got your flu vaccine and I refilled your Lantus pen. If there are any problems with Lantus please call us at the clinic.   We will check you A1c again in another 3 months   Visit Reminders: - Stop by the pharmacy to pick up your prescriptions  - Continue to work on your healthy eating habits and incorporating exercise into your daily life.   Feel free to call with any questions or concerns at any time, at [phone].   Take care,  Dr. Shary Key [facility]  Th?t tuy?t v?i khi ???c g?p b?n ngy hm nay!  Th?t tuy?t v?i khi b?n lun quan tm ??n s?c kh?e c?a mnh! A1c c?a b?n gi?m nh? so v?i l?n khm tr??c l 7,4. Ti?p t?c dng thu?c hi?n t?i, theo di ch? ?? ?n u?ng v t?p th? d?c.  Hm nay b?n c?ng ? tim v?c xin cm v ti ? ?? ??y l?i bt Lantus cho b?n. N?u c b?t k? v?n ?? g v?i Lantus vui lng g?i cho chng ti t?i phng khm.  Chng ti s? ki?m tra l?i A1c c?a b?n sau 3 thng n?a  Nh?c nh? th?m quan: - D?ng l?i ? hi?u thu?c ?? l?y ??n thu?c c?a b?n - Ti?p t?c duy tr thi quen ?n u?ng lnh m?nh v k?t h?p t?p th? d?c vo cu?c s?ng hng ngy.  Vui lng g?i n?u c b?t k? cu h?i ho?c th?c m?c no vo b?t k? lc no theo s? [phone].   B?o tr?ng, Ti?n s? Fredderick Erb tm y t? gia ?[place]

## 2022-02-03 NOTE — Assessment & Plan Note (Signed)
BP today 126/82.Normal echo in June. Continue Hyzaar 50-12.5 mg daily

## 2022-02-03 NOTE — Progress Notes (Signed)
    SUBJECTIVE:   CHIEF COMPLAINT / HPI:   Patient presents for diabetes follow up.   A1c 11 months ago was 7.4.  Currently taking metformin 1000 mg twice a day, Jardiance 10mg , Ozempic 2 mg weekly, Lantus 10 units daily   HTN: Currently on Hyzaar 50-12.5 mg daily  Has been avoiding salt in diet. Eats a lot of vegetables. Stops eating after 5/6pm Has been exercising by walking 30 minutes a day   Health maintenace  Due for colonoscopy- Asymptomatic. No family history of colon cancer.   PERTINENT  PMH / PSH: Reviewed  OBJECTIVE:   BP 126/82   Pulse 85   Wt 170 lb 6.4 oz (77.3 kg)   SpO2 100%   BMI 32.20 kg/m    Physical exam General: well appearing, NAD Cardiovascular: RRR, 2/6 systolic murmur  Lungs: CTAB. Normal WOB Abdomen: soft, non-distended, non-tender Skin: warm, dry. No edema  ASSESSMENT/PLAN:   Diabetes mellitus without complication (HCC) U5K currently 7.4, similar to previous A1c 11 months ago. Currently taking metformin 1000 mg twice a day, Ozempic 2 mg weekly, Lantus 10 units daily. Continue current regimen and lifestyle changes with diet and exercise. Diabetes follow up in 6 months   Essential hypertension BP today 126/82.Normal echo in June. Continue Hyzaar 50-12.5 mg daily  Health maintenance Received flu vaccine   [place]

## 2022-02-03 NOTE — Assessment & Plan Note (Signed)
A1c currently 7.4, similar to previous A1c 11 months ago. Currently taking metformin 1000 mg twice a day, Ozempic 2 mg weekly, Lantus 10 units daily. Continue current regimen and lifestyle changes with diet and exercise. Diabetes follow up in 6 months

## 2022-02-04 ENCOUNTER — Other Ambulatory Visit: Payer: Self-pay

## 2022-02-18 ENCOUNTER — Other Ambulatory Visit: Payer: Self-pay

## 2022-03-02 ENCOUNTER — Other Ambulatory Visit: Payer: Self-pay

## 2022-03-02 ENCOUNTER — Other Ambulatory Visit: Payer: Self-pay | Admitting: Family Medicine

## 2022-03-02 DIAGNOSIS — I10 Essential (primary) hypertension: Secondary | ICD-10-CM

## 2022-03-02 MED ORDER — LOSARTAN POTASSIUM-HCTZ 50-12.5 MG PO TABS
1.0000 | ORAL_TABLET | Freq: Every day | ORAL | 0 refills | Status: DC
  Filled 2022-03-02: qty 30, 30d supply, fill #0

## 2022-03-03 ENCOUNTER — Other Ambulatory Visit: Payer: Self-pay

## 2022-03-04 ENCOUNTER — Telehealth: Payer: Self-pay | Admitting: Family Medicine

## 2022-03-04 NOTE — Telephone Encounter (Signed)
Patient came by and dropped off forms for patient applying for citizenship in [place].  Paperwork is being sent to PCP today.  Form is N648.  Also another form with it from [place] being attached to it.  Phone # on file is correct. Sending back today putting in red folder.  Advised patient someone will get in touch with her regarding this.

## 2022-03-07 NOTE — Telephone Encounter (Signed)
Patient scheduled with PCP.

## 2022-03-07 NOTE — Telephone Encounter (Signed)
Attempted to call pt using pacific interpreters and no answer or VM set up. Will try again later. Whyatt Klinger Kennon Holter, CMA

## 2022-03-07 NOTE — Telephone Encounter (Signed)
Placed in MDs box to filled out. Jakin Pavao Kennon Holter, CMA

## 2022-03-09 NOTE — Congregational Nurse Program (Signed)
CN office visit with interpreter Paulina Fusi and [employer] intern Su Grand assisting.  Patient received letter from [facility] case manager stating they had been trying to reach the mother.  [employer] intern called on her behalf but had to leave message.  She also had [facility] bill.  We called [employer] to question why they did not pay her bill and it was determined the name on her insurance card did not match name on bill. They will resubmit claim to different department.  Since insurance ID card was incorrect patient will ask her husband to talk with HR/insurance person at his job in order to get a new card with correct spelling of name.  CN will then call [facility] billing back and provide new ID card.  Jake Michaelis RN, Congregational Nurse [phone]

## 2022-03-15 ENCOUNTER — Telehealth: Payer: Self-pay

## 2022-03-15 ENCOUNTER — Ambulatory Visit: Payer: No Typology Code available for payment source | Admitting: Family Medicine

## 2022-03-15 NOTE — Telephone Encounter (Signed)
Informed pt her ozempic pens are ready for pickup here at the doctors office.  4 boxes of ozempic 2mg  dose pens are labeled and ready in the med room fridge

## 2022-03-15 NOTE — Telephone Encounter (Signed)
Medication given to patient

## 2022-03-22 ENCOUNTER — Ambulatory Visit (INDEPENDENT_AMBULATORY_CARE_PROVIDER_SITE_OTHER): Payer: BC Managed Care – PPO | Admitting: Family Medicine

## 2022-03-22 ENCOUNTER — Other Ambulatory Visit: Payer: Self-pay

## 2022-03-22 VITALS — BP 117/80 | HR 81 | Ht 61.0 in | Wt 174.0 lb

## 2022-03-22 DIAGNOSIS — R4589 Other symptoms and signs involving emotional state: Secondary | ICD-10-CM

## 2022-03-22 DIAGNOSIS — E785 Hyperlipidemia, unspecified: Secondary | ICD-10-CM

## 2022-03-22 DIAGNOSIS — I10 Essential (primary) hypertension: Secondary | ICD-10-CM | POA: Diagnosis not present

## 2022-03-22 DIAGNOSIS — E1169 Type 2 diabetes mellitus with other specified complication: Secondary | ICD-10-CM

## 2022-03-22 DIAGNOSIS — E119 Type 2 diabetes mellitus without complications: Secondary | ICD-10-CM | POA: Diagnosis not present

## 2022-03-22 MED ORDER — ROSUVASTATIN CALCIUM 10 MG PO TABS
10.0000 mg | ORAL_TABLET | Freq: Every day | ORAL | 1 refills | Status: DC
  Filled 2022-03-22: qty 30, 30d supply, fill #0
  Filled 2022-04-29: qty 30, 30d supply, fill #1
  Filled 2022-05-24: qty 30, 30d supply, fill #2
  Filled 2022-06-29: qty 30, 30d supply, fill #3

## 2022-03-22 MED ORDER — INSULIN GLARGINE 100 UNIT/ML SOLOSTAR PEN
10.0000 [IU] | PEN_INJECTOR | SUBCUTANEOUS | 11 refills | Status: DC
  Filled 2022-03-22: qty 3, 30d supply, fill #0
  Filled 2022-04-29 (×2): qty 3, 30d supply, fill #1

## 2022-03-22 MED ORDER — LOSARTAN POTASSIUM-HCTZ 50-12.5 MG PO TABS
1.0000 | ORAL_TABLET | Freq: Every day | ORAL | 2 refills | Status: DC
  Filled 2022-03-22 – 2022-04-05 (×2): qty 30, 30d supply, fill #0
  Filled 2022-04-29 (×2): qty 30, 30d supply, fill #1
  Filled 2022-05-24: qty 30, 30d supply, fill #2

## 2022-03-22 MED ORDER — EMPAGLIFLOZIN 10 MG PO TABS
10.0000 mg | ORAL_TABLET | Freq: Every day | ORAL | 3 refills | Status: DC
  Filled 2022-03-22: qty 90, 90d supply, fill #0

## 2022-03-22 NOTE — Assessment & Plan Note (Addendum)
Last A1c was 7.4 on 02/03/2022 and she is not yet due for another check.  Currently taking metformin 1000 mg BID and until recently taking Lantus 10 units daily.  Diabetes appears to be better controlled than on initial presentation, though patient's reports of not taking Lantus for several days are concerning. - Refilled Lantus and Jardiance at patient's pharmacy to ensure she has access to these meds - Repeat A1c at next visit Dec 13

## 2022-03-22 NOTE — Assessment & Plan Note (Addendum)
Patient's blood pressure is well controlled at this visit on current losartan-HCTZ 50-12.5 mg daily regimen. - Refilled at losartan-HCTZ at patient's current pharmacy to ensure she has access to this medication

## 2022-03-22 NOTE — Assessment & Plan Note (Signed)
Patient is understandably upset and having to deal with significant life stressors.  She is clearly struggling and will need to return for follow up appointment to discuss mental health and for PHQ-9 screening.  Unable to fully explore patient's mental health at this visit.

## 2022-03-22 NOTE — Patient Instructions (Addendum)
Th?t tuy?t v?i khi ???c g?p b?n ngy hm nay.  Vui lng mang theo T?T C? cc lo?i thu?c c?a b?n trong m?i l?n khm.  Hm nay chng ta ? ni v?:  Ti ? n?p l?i Jardiance v Lantus c?a b?n. B?n s? ph?i ki?m tra b?nh ti?u ???ng vo thng t?i. N?u b?n thay ??i  ??nh v? cc bi?u m?u, vui lng cho ti bi?t. Ti mu?n h? tr? b?n n?u c th?.  C?m ?n b?n ? ??n th?m theo l?ch trnh. G?n ?y [place] ti ? g?p ph?i m?t v?n ?? l?n l "v?ng m?t" v ?i?u ny h?n ch? ?ng k? kh? n?ng khm v ch?m [place] b?nh nhn c?a chng ti. Nh? m?t l?i nh?c nh? thn thi?n - n?u b?n khng th? ??t l?ch h?n, vui lng g?i ?i?n ?? h?y. Chng ti c chnh sch v?ng m?t ??i v?i nh?ng ng??i khng h?y trong vng 24 gi?Marland Kitchen Chnh sch c?a chng ti l n?u b?n b? l? ho?c khng h?y cu?c h?n trong vng 24 gi?, 3 l?n trong kho?ng th?i gian 6 thng, b?n c th? b? ?u?i kh?i phng khm c?a chng ti.  C?m ?n b?n ? l?a ch?n [facility].  Vui lng g?i t?i s? [phone] n?u c b?t k? th?c m?c no v? cu?c h?n hm nay.  Hy nh? ??t l?ch theo di t?i qu?y l? tn tr??c khi b?n r?i ?i hm nay.  Sabino Dick, DO Y h?c gia ?nh PGY-3  It was wonderful to see you today.  Please bring ALL of your medications with you to every visit.   Today we talked about:  I have refilled your Jardiance and your Lantus.  You are due for another diabetes check next month.   If you change your mind about the forms, please let me know. I'd like to assist you if possible.  Thank you for coming to your visit as scheduled. We have had a large "no-show" problem lately, and this significantly limits our ability to see and care for patients. As a friendly reminder- if you cannot make your appointment please call to cancel. We do have a no show policy for those who do not cancel within 24 hours. Our policy is that if you miss or fail to cancel an appointment within 24 hours, 3 times in a 68-month period, you may be dismissed from our clinic.    Thank you for choosing [facility].   Please call [phone] with any questions about today's appointment.  Please be sure to schedule follow up at the front  desk before you leave today.   Sabino Dick, DO PGY-3 Family Medicine

## 2022-03-22 NOTE — Progress Notes (Cosign Needed Addendum)
SUBJECTIVE:   CHIEF COMPLAINT / HPI:   [place] interpreter used for entirety of encounter.  Morgan Osborne is a 46 y.o. female who presents to the [facility] clinic today to discuss the following concerns:   Discuss [phone] Form Patient initially wanted to discuss starting the [phone] Form during this visit.  She states her need to care for her son 24/7 is the condition preventing her from learning [place].  She arrived to the [place] from [place] in October 21, 2015.  She is currently not working as she has to stay home with her terminally ill 88 year old son 24/7 to care for him.  She has a driver's license and requires an interpreter for medical visits.  After discussion, the patient changed her mind about wanting to pursue the N648 process due to how long it may take, stating that she can just learn English instead. She seemed very adamant about this.   Depressed mood The patient has never had any formal psychiatric diagnosis and is not taking any psychiatric medications currently.  When asked about her mood, she becomes tearful and states that she is very worried because her 49 year old son is dying and she is caring for him at home.  She states that he may need a transplant and is very sick due to heart and lung issues.  She notes that she reads her Bible for comfort and her religion is one of her main sources of support.  She is stressed because she is unable to work and is generally having a difficult time.  She did not fill out a PHQ-9 at this visit.  PERTINENT  PMH / PSH: HTN, GERD, HLD Refugee status, arrived from [place] in June 2017. Currently has son very sick at home, possibly with terminal condition, and patient is staying home 24/7 to care for him.  She is unable to work at this time.  OBJECTIVE:   BP 117/80   Pulse 81   Ht 5\' 1"  (1.549 m)   Wt 174 lb (78.9 kg)   SpO2 100%   BMI 32.88 kg/m    General: Resting in chair, NAD, alert and at baseline, tired appearing. Cardiovascular: Regular  rate and rhythm. Normal S1/S2. No murmurs, rubs, or gallops appreciated. 2+ radial pulses. Psych: Tearful affect and low mood, becomes tearful repeatedly during conversation when discussing health of her sick son.  ASSESSMENT/PLAN:   Diabetes mellitus without complication (HCC) Last A1c was 7.4 on 02/03/2022 and she is not yet due for another check.  Currently taking metformin 1000 mg BID and until recently taking Lantus 10 units daily.  Diabetes appears to be better controlled than on initial presentation, though patient's reports of not taking Lantus for several days are concerning. - Refilled Lantus and Jardiance at patient's pharmacy to ensure she has access to these meds - Repeat A1c at next visit Dec 13  Depressed mood Patient is understandably upset and having to deal with significant life stressors.  She is clearly struggling and will need to return for follow up appointment to discuss mental health and for PHQ-9 screening.  Unable to fully explore patient's mental health at this visit.  Essential hypertension Patient's blood pressure is well controlled at this visit on current losartan-HCTZ 50-12.5 mg daily regimen. - Refilled at losartan-HCTZ at patient's current pharmacy to ensure she has access to this medication  Hyperlipidemia associated with type 2 diabetes mellitus (HCC) Patient currently taking rosuvastatin 10 mg daily.  Last LDL of 58  on 11/16/2020. - Refilled rosuvastatin at patient's pharmacy to ensure she has access to this medication  N648 Discussion Will hold off on pursuing [phone] Form completion and referral to [facility] to respect patient's request.  May revisit this topic at future visits once patient's mental health is further addressed as she appears overwhelmed at this visit.    [facility] doctor, [employer] MS-4  I was personally present and performed or re-performed the history, physical exam and medical decision making activities of this service and  have verified that the service and findings are accurately documented in the student's note.  Sabino Dick, DO                  03/22/2022, 6:04 PM

## 2022-03-22 NOTE — Assessment & Plan Note (Signed)
Patient currently taking rosuvastatin 10 mg daily.  Last LDL of 58 on 11/16/2020. - Refilled rosuvastatin at patient's pharmacy to ensure she has access to this medication

## 2022-03-22 NOTE — Progress Notes (Deleted)
    SUBJECTIVE:   CHIEF COMPLAINT / HPI:   [place] interpreter used for entirety of encounter.  Morgan Osborne is a 46 y.o. female who presents to the [facility] clinic today to discuss the following concerns:   Discuss N63 Form   PERTINENT  PMH / PSH: ***  OBJECTIVE:   There were no vitals taken for this visit.   General: NAD, pleasant, able to participate in exam Respiratory: normal effort Neuro: alert, no obvious focal deficits Psych: Normal affect and mood  ASSESSMENT/PLAN:   No problem-specific Assessment & Plan notes found for this encounter.     Sabino Dick, DO  [facility]

## 2022-03-23 ENCOUNTER — Other Ambulatory Visit: Payer: Self-pay

## 2022-03-24 ENCOUNTER — Telehealth: Payer: Self-pay | Admitting: Family Medicine

## 2022-03-24 DIAGNOSIS — E119 Type 2 diabetes mellitus without complications: Secondary | ICD-10-CM | POA: Diagnosis not present

## 2022-03-24 DIAGNOSIS — I10 Essential (primary) hypertension: Secondary | ICD-10-CM | POA: Diagnosis not present

## 2022-03-24 DIAGNOSIS — E785 Hyperlipidemia, unspecified: Secondary | ICD-10-CM | POA: Diagnosis not present

## 2022-03-24 NOTE — Telephone Encounter (Signed)
Patient walked in stating she use the prescription  Lantus and Basaglar, prescribed yesterday and had diarrhea seven times through out the night. Please call [phone]

## 2022-03-25 NOTE — Telephone Encounter (Signed)
Called patient and she states that she is doing much better.  She took some anti diarrhea medication and it has helped.  Used [employer] [facility] # [id number].  Glennie Hawk, CMA

## 2022-04-05 ENCOUNTER — Other Ambulatory Visit: Payer: Self-pay

## 2022-04-18 NOTE — Progress Notes (Signed)
    SUBJECTIVE:   CHIEF COMPLAINT / HPI:   In-person [place] interpreter used for entirety of visit.   Morgan Osborne is a 46 y.o. female who presents to the [facility] clinic today to discuss the following concerns:   Mood Follow Up At last visit patient discussed stressors including having to be caretaker for her 60 year-old son who is suffering from chronic and terminal conditions. She was tearful. She returns today to follow up on mood. She reports that she feels well. She is doing things for herself like going on walks, learning [place]. She is not interested in therapy or medications at this point. She prefers to continue learning Bible verses and leaning on God.   Diabetes, Type 2 - Last A1c 7.4 in September  - Medications: 10U insulin glargine, Metformin 1000 mg BID, jardiance 10 mg, ozempic 2 mg weekly - Compliance: Good - Checking BG at home: Fasting sugars are around 110-120 - Exercise: Walks 30 minutes most days (if not rainy) - Eye exam: Referral placed in May  - Microalbumin: Due - Statin: on 10 mg rosuvastatin  - Denies symptoms of hypoglycemia, polyuria, polydipsia, numbness extremities, foot ulcers/trauma  PERTINENT  PMH / PSH: Refugee, T2DM, HTN, HLD, GERD  OBJECTIVE:   BP 117/77   Pulse 69   Ht 5\' 1"  (1.549 m)   Wt 169 lb 6 oz (76.8 kg)   SpO2 100%   BMI 32.00 kg/m    General: NAD, pleasant, able to participate in exam Respiratory: normal effort Psych: Normal affect and mood     04/20/2022   10:23 AM 02/03/2022    1:33 PM 09/22/2021    3:59 PM  Depression screen PHQ 2/9  Decreased Interest 0 0 0  Down, Depressed, Hopeless 0 0 0  PHQ - 2 Score 0 0 0  Altered sleeping 0 0 0  Tired, decreased energy 1 0 0  Change in appetite 0 0 0  Feeling bad or failure about yourself  0 0 0  Trouble concentrating 0 0 0  Moving slowly or fidgety/restless 0 0 0  Suicidal thoughts 0 0 0  PHQ-9 Score 1 0 0  Difficult doing work/chores Not difficult at all       ASSESSMENT/PLAN:   Essential hypertension At goal today. Continue Hyzaar.  -BMP today  Hyperlipidemia associated with type 2 diabetes mellitus (HCC) Continue Rosuvastatin. -Lipid panel today   Diabetes mellitus without complication (HCC) A1c is slightly improved from previous. Fasting sugars seen appropriate. She has lost 5 lbs since last visit in November. No signs of hypoglycemia. Continue current regimen -Next A1c in 3 months -Continue 10U glargine, Metformin 1000 mg BID, Jardiance 10 mg daily, Ozempic 2 mg weekly  -Continue ARB, statin  -Urine microalbumin    December, DO [place] [facility]

## 2022-04-18 NOTE — Patient Instructions (Incomplete)
Th?t tuy?t v?i khi ???c g?p b?n ngy hm nay.  Vui lng mang theo T?T C? cc lo?i thu?c c?a b?n trong m?i l?n khm.  Hm nay chng ta ? ni v?:  B?nh ti?u ???ng c?a b?n ???c ki?m sot t?t. Khng c thay ??i thu?c vo th?i ?i?m ny. Vui lng quay l?i sau 3 thng ?? ki?m tra b?nh ti?u ???ng ti?p theo.  Hy ti?p t?c cng vi?c tuy?t v?i v?i bi t?p c?a b?n. Hy cho ti bi?t n?u ti c th? lm g khc ?? h? tr? b?n trong th?i gian ny.  S? d?ng ??n ??ng k ?? ??ng k H? tr? Ti chnh c?a [facility].  C?m ?n b?n ? ??n th?m theo l?ch trnh. G?n ?y [place] ti ? g?p ph?i m?t v?n ?? l?n l "v?ng m?t" v ?i?u ny h?n ch? ?ng k? kh? n?ng khm v ch?m [place] b?nh nhn c?a chng ti. Nh? m?t l?i nh?c nh? thn thi?n- n?u b?n khng th? ??t l?ch h?n, vui lng g?i ?i?n ?? h?y. Chng ti c chnh sch v?ng m?t ??i v?i nh?ng ng??i khng h?y trong vng 24 gi?Marland Kitchen Chnh sch c?a chng ti l n?u b?n b? l? ho?c khng h?y cu?c h?n trong vng 24 gi?, 3 l?n trong kho?ng th?i gian 6 thng, b?n c th? b? ?u?i kh?i phng khm c?a chng ti.  C?m ?n b?n ? l?a ch?n [facility].  Vui lng g?i t?i s? [phone] n?u c b?t k? th?c m?c no v? cu?c h?n hm nay.  Hy nh? ??t l?ch theo di t?i qu?y l? tn tr??c khi b?n r?i ?i hm nay.  Sabino Dick, DO Y h?c gia ?nh PGY-3

## 2022-04-20 ENCOUNTER — Encounter: Payer: Self-pay | Admitting: Family Medicine

## 2022-04-20 ENCOUNTER — Ambulatory Visit: Payer: BC Managed Care – PPO | Admitting: Family Medicine

## 2022-04-20 VITALS — BP 117/77 | HR 69 | Ht 61.0 in | Wt 169.4 lb

## 2022-04-20 DIAGNOSIS — I10 Essential (primary) hypertension: Secondary | ICD-10-CM

## 2022-04-20 DIAGNOSIS — E1169 Type 2 diabetes mellitus with other specified complication: Secondary | ICD-10-CM | POA: Diagnosis not present

## 2022-04-20 DIAGNOSIS — E785 Hyperlipidemia, unspecified: Secondary | ICD-10-CM | POA: Diagnosis not present

## 2022-04-20 DIAGNOSIS — E119 Type 2 diabetes mellitus without complications: Secondary | ICD-10-CM | POA: Diagnosis not present

## 2022-04-20 LAB — POCT GLYCOSYLATED HEMOGLOBIN (HGB A1C): HbA1c, POC (controlled diabetic range): 7.2 % — AB (ref 0.0–7.0)

## 2022-04-20 NOTE — Assessment & Plan Note (Signed)
At goal today. Continue Hyzaar.  -BMP today

## 2022-04-20 NOTE — Assessment & Plan Note (Signed)
Continue Rosuvastatin. -Lipid panel today

## 2022-04-20 NOTE — Assessment & Plan Note (Signed)
A1c is slightly improved from previous. Fasting sugars seen appropriate. She has lost 5 lbs since last visit in November. No signs of hypoglycemia. Continue current regimen -Next A1c in 3 months -Continue 10U glargine, Metformin 1000 mg BID, Jardiance 10 mg daily, Ozempic 2 mg weekly  -Continue ARB, statin  -Urine microalbumin

## 2022-04-21 ENCOUNTER — Encounter: Payer: Self-pay | Admitting: Family Medicine

## 2022-04-21 LAB — MICROALBUMIN / CREATININE URINE RATIO
Creatinine, Urine: 34 mg/dL
Microalb/Creat Ratio: 12 mg/g creat (ref 0–29)
Microalbumin, Urine: 4 ug/mL

## 2022-04-21 LAB — BASIC METABOLIC PANEL
BUN/Creatinine Ratio: 22 (ref 9–23)
BUN: 14 mg/dL (ref 6–24)
CO2: 23 mmol/L (ref 20–29)
Calcium: 9.7 mg/dL (ref 8.7–10.2)
Chloride: 100 mmol/L (ref 96–106)
Creatinine, Ser: 0.65 mg/dL (ref 0.57–1.00)
Glucose: 112 mg/dL — ABNORMAL HIGH (ref 70–99)
Potassium: 4.4 mmol/L (ref 3.5–5.2)
Sodium: 138 mmol/L (ref 134–144)
eGFR: 109 mL/min/{1.73_m2} (ref >59)

## 2022-04-21 LAB — LIPID PANEL
Chol/HDL Ratio: 2.9 ratio (ref 0.0–4.4)
Cholesterol, Total: 135 mg/dL (ref 100–199)
HDL: 47 mg/dL (ref >39)
LDL Chol Calc (NIH): 72 mg/dL (ref 0–99)
Triglycerides: 83 mg/dL (ref 0–149)
VLDL Cholesterol Cal: 16 mg/dL (ref 5–40)

## 2022-04-29 ENCOUNTER — Other Ambulatory Visit: Payer: Self-pay

## 2022-05-04 ENCOUNTER — Other Ambulatory Visit: Payer: Self-pay

## 2022-05-04 ENCOUNTER — Telehealth: Payer: Self-pay

## 2022-05-04 NOTE — Telephone Encounter (Signed)
[place] interpreter via [employer] was used.   The  patient came to [facility] today to speak to the financial counselor, Anabel Halon  about applying for [employer] Emer[employer] Card. However, she has insurance with [employer].  She had bills with her that she said she is not able to pay.  JT and I instructed her to contact [employer] and/or her husband's employer regarding the insurance coverage and out of pocked expense.  She also explained that she was denied food stamps because of her husband's income.  She said her child is disabled and currently in the hospital. We also told her that she can request to speak to a financial counselor/billing dept  at the hospital about her bills.  She was also provided with the phone number for [facility] billing department.   I gave her the number to call Legal Aid of [facility]) with further questions about her insurance coverage and food stamp denial : [phone].  I told her that I could call Legal Aid for her an ask them to call her back to discuss her concerns and she was in agreement to having me place the initial call for her. I confirmed her best call back number : [phone].  I then tried calling [facility] [phone] twice this afternoon and there was no option to leave a message at that number.  Will need to try again

## 2022-05-05 NOTE — Telephone Encounter (Signed)
I called [facility] again this evening after 1730 when they have office hours in the evening.  The call was automatically placed on hold and the recording stated that the wait time could be 30 minutes or more.  I held for 20 minutes and there was no option to leave a message and/ or call back number, so I had to hang up.

## 2022-05-05 NOTE — Telephone Encounter (Signed)
  I then tried calling [facility] [phone] this afternoon and the office was closed.  No option to leave a message

## 2022-05-13 ENCOUNTER — Other Ambulatory Visit: Payer: Self-pay

## 2022-05-24 ENCOUNTER — Other Ambulatory Visit: Payer: Self-pay

## 2022-06-05 ENCOUNTER — Other Ambulatory Visit: Payer: Self-pay | Admitting: Family Medicine

## 2022-06-05 DIAGNOSIS — I10 Essential (primary) hypertension: Secondary | ICD-10-CM

## 2022-06-29 ENCOUNTER — Other Ambulatory Visit: Payer: Self-pay | Admitting: Family Medicine

## 2022-06-29 ENCOUNTER — Other Ambulatory Visit: Payer: Self-pay

## 2022-06-29 MED ORDER — ACCU-CHEK GUIDE VI STRP
ORAL_STRIP | 12 refills | Status: DC
  Filled 2022-06-29: qty 100, 50d supply, fill #0

## 2022-06-30 ENCOUNTER — Other Ambulatory Visit: Payer: Self-pay | Admitting: Family Medicine

## 2022-06-30 ENCOUNTER — Other Ambulatory Visit: Payer: Self-pay

## 2022-07-01 ENCOUNTER — Other Ambulatory Visit: Payer: Self-pay

## 2022-07-01 MED ORDER — ACCU-CHEK SOFTCLIX LANCETS MISC
0 refills | Status: DC
  Filled 2022-07-01: qty 100, 50d supply, fill #0

## 2022-07-03 ENCOUNTER — Other Ambulatory Visit: Payer: Self-pay | Admitting: Family Medicine

## 2022-07-07 ENCOUNTER — Other Ambulatory Visit: Payer: Self-pay

## 2022-07-08 ENCOUNTER — Telehealth: Payer: Self-pay

## 2022-07-08 NOTE — Telephone Encounter (Signed)
Patient presented to [facility] for Ozempic pick up through medication assistance.  There was no medication for her in the med room and I do not see where Rosendo Gros called her for pick up.   Will forward to [place] to clarify.

## 2022-07-11 ENCOUNTER — Other Ambulatory Visit: Payer: Self-pay | Admitting: Family Medicine

## 2022-07-11 ENCOUNTER — Other Ambulatory Visit: Payer: Self-pay

## 2022-07-11 ENCOUNTER — Other Ambulatory Visit (HOSPITAL_COMMUNITY): Payer: Self-pay

## 2022-07-11 MED ORDER — OZEMPIC (2 MG/DOSE) 8 MG/3ML ~~LOC~~ SOPN
2.0000 mg | PEN_INJECTOR | SUBCUTANEOUS | 1 refills | Status: DC
  Filled 2022-07-11: qty 3, 28d supply, fill #0

## 2022-07-11 NOTE — Telephone Encounter (Signed)
Rx sent to preferred pharmacy.

## 2022-07-11 NOTE — Telephone Encounter (Signed)
Called back to leave message with patients niece. Requested call back again to discuss patients insurance & medication coverage for 2024.  - Patient has commercial insurance and ozempic copay is $24.99 for month supply

## 2022-07-11 NOTE — Telephone Encounter (Signed)
Left message requesting call back regarding ozempic enrollment and shipment.  Call back [phone]

## 2022-07-11 NOTE — Telephone Encounter (Signed)
Patients niece returned call. Informed her that Morgan Osborne will not qualify for PAP any longer due to being commercially insured. Also informed that the medication is covered on her insurance with a $24.99 copay.   Niece expressed understanding and would like a new rx to be sent to community pharmacy at [facility] center.

## 2022-07-12 ENCOUNTER — Other Ambulatory Visit: Payer: Self-pay

## 2022-07-13 ENCOUNTER — Other Ambulatory Visit: Payer: Self-pay

## 2022-07-14 ENCOUNTER — Other Ambulatory Visit: Payer: Self-pay

## 2022-08-03 ENCOUNTER — Other Ambulatory Visit: Payer: Self-pay | Admitting: Family Medicine

## 2022-08-03 DIAGNOSIS — E785 Hyperlipidemia, unspecified: Secondary | ICD-10-CM

## 2022-08-04 ENCOUNTER — Other Ambulatory Visit: Payer: Self-pay | Admitting: Family Medicine

## 2022-08-04 NOTE — Telephone Encounter (Signed)
Prescriber not at this practice, no protocol.  Requested Prescriptions  Pending Prescriptions Disp Refills   metFORMIN (GLUCOPHAGE) 1000 MG tablet [Pharmacy Med Name: metFORMIN HCl 1000 MG Oral Tablet] 180 tablet 0    Sig: Take 1 tablet by mouth twice daily     There is no refill protocol information for this order

## 2022-08-05 NOTE — Telephone Encounter (Signed)
Unable to refill per protocol, last refill by another provider not at this practice. Duplicate request.  Requested Prescriptions  Pending Prescriptions Disp Refills   metFORMIN (GLUCOPHAGE) 1000 MG tablet [Pharmacy Med Name: metFORMIN HCl 1000 MG Oral Tablet] 180 tablet 0    Sig: Take 1 tablet by mouth twice daily     There is no refill protocol information for this order

## 2022-08-15 ENCOUNTER — Other Ambulatory Visit: Payer: Self-pay

## 2022-08-23 NOTE — Patient Instructions (Addendum)
It was wonderful to see you today.  Please bring ALL of your medications with you to every visit.   Today we talked about:  I think you may have tendonitis on your shoulder.  You have good strength   I have sent an anti-inflammatory medicine called Meloxicam which you should take once a day.  Do not take Ibuprofen, Advil, or Aleve when taking this.   You can take Tylenol every 6 hours as needed for pain.  I have attached exercises below to help rehabilitate your shoulder. Rest your arms, avoid overuse- especially with movements that cause pain.  Ice can also help with inflammation and pain.  Thank you for coming to your visit as scheduled. We have had a large "no-show" problem lately, and this significantly limits our ability to see and care for patients. As a friendly reminder- if you cannot make your appointment please call to cancel. We do have a no show policy for those who do not cancel within 24 hours. Our policy is that if you miss or fail to cancel an appointment within 24 hours, 3 times in a 40-month period, you may be dismissed from our clinic.   Thank you for choosing [facility].   Please call [phone] with any questions about today's appointment.  Please be sure to schedule follow up at the front  desk before you leave today.   Sabino Dick, DO PGY-3 Family Medicine    Th?t tuy?t v?i khi ???c g?p b?n ngy hm nay.  Vui lng mang theo T?T C? cc lo?i thu?c c?a b?n trong m?i l?n khm.  Hm nay chng ta ? ni v?:  Ti ngh? c th? b?n b? vim gn ? vai. B?n c s?c m?nh t?t  Ti ? g?i m?t lo?i thu?c ch?ng vim tn l Meloxicam m b?n nn u?ng m?i ngy m?t l?n. Khng dng Ibuprofen, Advil ho?c Aleve khi dng thu?c ny.  B?n c th? dng Tylenol m?i 6 gi? n?u c?n ?? gi?m ?au.  Ti c ?nh km cc bi t?p bn d??i ?? gip b?n ph?c h?i vai. Hy ?? cnh tay c?a b?n ???c ngh? ng?i, trnh s? d?ng qu m?c - ??c bi?t l v?i nh?ng ??ng tc gy  ?au. N??c ? c?ng c th? gip gi?m vim v gi?m ?au.  C?m ?n b?n ? ??n th?m theo l?ch trnh. G?n ?y [place] ti ? g?p ph?i m?t v?n ?? l?n l "v?ng m?t" v ?i?u ny h?n ch? ?ng k? kh? n?ng khm v ch?m [place] b?nh nhn c?a chng ti. Nh? m?t l?i nh?c nh? thn thi?n - n?u b?n khng th? ??t l?ch h?n, vui lng g?i ?i?n ?? h?y. Chng ti c chnh sch v?ng m?t ??i v?i nh?ng ng??i khng h?y trong vng 24 gi?[employer] Chnh sch c?a chng ti l n?u b?n b? l? ho?c khng h?y cu?c h?n trong vng 24 gi?, 3 l?n trong kho?ng th?i gian 6 thng, b?n c th? b? ?u?i kh?i phng khm c?a chng ti.  C?m ?n b?n ? l?a ch?n [facility].  Vui lng g?i t?i s? [phone] n?u c b?t k? th?c m?c no v? cu?c h?n hm nay.  Hy nh? ??t l?ch theo di t?i qu?y l? tn tr??c khi b?n r?i ?i hm nay.  Sabino Dick, DO Y h?c gia ?nh PGY-3   Shoulder Exercises Ask your health care provider which exercises are safe for you. Do exercises exactly as told by your health care provider and adjust them as  directed. It is normal to feel mild stretching, pulling, tightness, or discomfort as you do these exercises. Stop right away if you feel sudden pain or your pain gets worse. Do not begin these exercises until told by your health care provider. Stretching exercises External rotation and abduction This exercise is sometimes called corner stretch. The exercise rotates your arm outward (external rotation) and moves your arm out from your body (abduction). Stand in a doorway with one of your feet slightly in front of the other. This is called a staggered stance. If you cannot reach your forearms to the door frame, stand facing a corner of a room. Choose one of the following positions as told by your health care provider: Place your hands and forearms on the door frame above your head. Place your hands and forearms on the door frame at the height of your head. Place your hands on the door frame at the height of your  elbows. Slowly move your weight onto your front foot until you feel a stretch across your chest and in the front of your shoulders. Keep your head and chest upright and keep your abdominal muscles tight. Hold for __________ seconds. To release the stretch, shift your weight to your back foot. Repeat __________ times. Complete this exercise __________ times a day. Extension, standing  Stand and hold a broomstick, a cane, or a similar object behind your back. Your hands should be a little wider than shoulder-width apart. Your palms should face away from your back. Keeping your elbows straight and your shoulder muscles relaxed, move the stick away from your body until you feel a stretch in your shoulders (extension). Avoid shrugging your shoulders while you move the stick. Keep your shoulder blades tucked down toward the middle of your back. Hold for __________ seconds. Slowly return to the starting position. Repeat __________ times. Complete this exercise __________ times a day. Range-of-motion exercises Pendulum  Stand near a wall or a surface that you can hold onto for balance. Bend at the waist and let your left / right arm hang straight down. Use your other arm to support you. Keep your back straight and do not lock your knees. Relax your left / right arm and shoulder muscles, and move your hips and your trunk so your left / right arm swings freely. Your arm should swing because of the motion of your body, not because you are using your arm or shoulder muscles. Keep moving your hips and trunk so your arm swings in the following directions, as told by your health care provider: Side to side. Forward and backward. In clockwise and counterclockwise circles. Continue each motion for __________ seconds, or for as long as told by your health care provider. Slowly return to the starting position. Repeat __________ times. Complete this exercise __________ times a day. Shoulder flexion,  standing  Stand and hold a broomstick, a cane, or a similar object. Place your hands a little more than shoulder-width apart on the object. Your left / right hand should be palm-up, and your other hand should be palm-down. Keep your elbow straight and your shoulder muscles relaxed. Push the stick up with your healthy arm to raise your left / right arm in front of your body, and then over your head until you feel a stretch in your shoulder (flexion). Avoid shrugging your shoulder while you raise your arm. Keep your shoulder blade tucked down toward the middle of your back. Hold for __________ seconds. Slowly return to the starting  position. Repeat __________ times. Complete this exercise __________ times a day. Shoulder abduction, standing  Stand and hold a broomstick, a cane, or a similar object. Place your hands a little more than shoulder-width apart on the object. Your left / right hand should be palm-up, and your other hand should be palm-down. Keep your elbow straight and your shoulder muscles relaxed. Push the object across your body toward your left / right side. Raise your left / right arm to the side of your body (abduction) until you feel a stretch in your shoulder. Do not raise your arm above shoulder height unless your health care provider tells you to do that. If directed, raise your arm over your head. Avoid shrugging your shoulder while you raise your arm. Keep your shoulder blade tucked down toward the middle of your back. Hold for __________ seconds. Slowly return to the starting position. Repeat __________ times. Complete this exercise __________ times a day. Internal rotation  Place your left / right hand behind your back, palm-up. Use your other hand to dangle an exercise band, a broomstick, or a similar object over your shoulder. Grasp the band with your left / right hand so you are holding on to both ends. Gently pull up on the band until you feel a stretch in the front of  your left / right shoulder. The movement of your arm toward the center of your body is called internal rotation. Avoid shrugging your shoulder while you raise your arm. Keep your shoulder blade tucked down toward the middle of your back. Hold for __________ seconds. Release the stretch by letting go of the band and lowering your hands. Repeat __________ times. Complete this exercise __________ times a day. Strengthening exercises External rotation  Sit in a stable chair without armrests. Secure an exercise band to a stable object at elbow height on your left / right side. Place a soft object, such as a folded towel or a small pillow, between your left / right upper arm and your body to move your elbow about 4 inches (10 cm) away from your side. Hold the end of the exercise band so it is tight and there is no slack. Keeping your elbow pressed against the soft object, slowly move your forearm out, away from your abdomen (external rotation). Keep your body steady so only your forearm moves. Hold for __________ seconds. Slowly return to the starting position. Repeat __________ times. Complete this exercise __________ times a day. Shoulder abduction  Sit in a stable chair without armrests, or stand up. Hold a __________ lb / kg weight in your left / right hand, or hold an exercise band with both hands. Start with your arms straight down and your left / right palm facing in, toward your body. Slowly lift your left / right hand out to your side (abduction). Do not lift your hand above shoulder height unless your health care provider tells you that this is safe. Keep your arms straight. Avoid shrugging your shoulder while you do this movement. Keep your shoulder blade tucked down toward the middle of your back. Hold for __________ seconds. Slowly lower your arm, and return to the starting position. Repeat __________ times. Complete this exercise __________ times a day. Shoulder extension  Sit in  a stable chair without armrests, or stand up. Secure an exercise band to a stable object in front of you so it is at shoulder height. Hold one end of the exercise band in each hand. Straighten your elbows and  lift your hands up to shoulder height. Squeeze your shoulder blades together as you pull your hands down to the sides of your thighs (extension). Stop when your hands are straight down by your sides. Do not let your hands go behind your body. Hold for __________ seconds. Slowly return to the starting position. Repeat __________ times. Complete this exercise __________ times a day. Shoulder row  Sit in a stable chair without armrests, or stand up. Secure an exercise band to a stable object in front of you so it is at chest height. Hold one end of the exercise band in each hand. Position your palms so that your thumbs are facing the ceiling (neutral position). Bend each of your elbows to a 90-degree angle (right angle) and keep your upper arms at your sides. Step back or move the chair back until the band is tight and there is no slack. Slowly pull your elbows back behind you. Hold for __________ seconds. Slowly return to the starting position. Repeat __________ times. Complete this exercise __________ times a day. Shoulder press-ups  Sit in a stable chair that has armrests. Sit upright, with your feet flat on the floor. Put your hands on the armrests so your elbows are bent and your fingers are pointing forward. Your hands should be about even with the sides of your body. Push down on the armrests and use your arms to lift yourself off the chair. Straighten your elbows and lift yourself up as much as you comfortably can. Move your shoulder blades down, and avoid letting your shoulders move up toward your ears. Keep your feet on the ground. As you get stronger, your feet should support less of your body weight as you lift yourself up. Hold for __________ seconds. Slowly lower yourself  back into the chair. Repeat __________ times. Complete this exercise __________ times a day. Wall push-ups  Stand so you are facing a stable wall. Your feet should be about one arm-length away from the wall. Lean forward and place your palms on the wall at shoulder height. Keep your feet flat on the floor as you bend your elbows and lean forward toward the wall. Hold for __________ seconds. Straighten your elbows to push yourself back to the starting position. Repeat __________ times. Complete this exercise __________ times a day. This information is not intended to replace advice given to you by your health care provider. Make sure you discuss any questions you have with your health care provider. Document Revised: 06/15/2021 Document Reviewed: 06/15/2021 Elsevier Patient Education  2023 [employer].

## 2022-08-23 NOTE — Progress Notes (Signed)
SUBJECTIVE:   CHIEF COMPLAINT / HPI:   Morgan Osborne is a 47 y.o. female who presents to the [facility] clinic today to discuss the following concerns:   Bilateral Shoulder Pain  Aching some during the day but worse at night. Been over a week since onset. Right now rates 4/10, at night 8/10. Hasn't tried any medications for this. No injuries. Makes it hard to sleep. Feels like her arms are weak. Does not work at she stays at home to care for her daughter. Has not tried anything for the pain.  Itchy Hands Ongoing for about a week. On dorsal hands and in between fingers. Not on volar surface. Has not tried anything for this. Has been working in the garden more, doesn't wear gloves.   PERTINENT  PMH / PSH: Hypertension, type 2 diabetes, GERD  OBJECTIVE:   BP 108/80   Pulse 75   Ht  (1.549 m)   Wt 169 lb 6.4 oz (76.8 kg)   SpO2 96%   BMI 32.01 kg/m    General: NAD, pleasant, able to participate in exam Respiratory:  normal effort MSK:  Left Shoulder: Inspection reveals no obvious deformity, atrophy, or asymmetry b/l. No bruising. No swelling Palpation is normal with no TTP over [facility] joint or bicipital groove b/l. Full ROM in flexion, abduction, internal/external rotation b/l but does have discomfort with full flexion and abduction NV intact distally b/l Normal scapular function observed b/l Special Tests:  - Impingement: Neg Hawkins - Supraspinatous: Discomfort with empty can - Infraspinatous/Teres Minor: 5/5 strength with ER - Subscapularis: 5/5 strength with IR - Labrum: good stability - No painful arc and no drop arm sign  Right Shoulder: Inspection reveals no obvious deformity, atrophy, or asymmetry b/l. No bruising. No swelling Palpation is normal with no TTP over [facility] joint or bicipital groove b/l. Full ROM in flexion, abduction, internal/external rotation b/l NV intact distally b/l Normal scapular function observed b/l Special Tests:  - Impingement: Neg Hawkins -  Supraspinatous: Negative empty can - Infraspinatous/Teres Minor: 5/5 strength with ER - Subscapularis: 5/5 strength with IR - Labrum: good stability - Negative apprehension test - No painful arc and no drop arm sign Skin: warm and dry, no rashes noted Psych: Intermittently tearful when discussing her daughter       08/24/2022   11:03 AM 04/20/2022   10:23 AM 02/03/2022    1:33 PM  Depression screen PHQ 2/9  Decreased Interest 0 0 0  Down, Depressed, Hopeless 0 0 0  PHQ - 2 Score 0 0 0  Altered sleeping 0 0 0  Tired, decreased energy 0 1 0  Change in appetite 0 0 0  Feeling bad or failure about yourself  0 0 0  Trouble concentrating 0 0 0  Moving slowly or fidgety/restless 0 0 0  Suicidal thoughts 0 0 0  PHQ-9 Score 0 1 0  Difficult doing work/chores  Not difficult at all    ASSESSMENT/PLAN:   1. Type 2 diabetes mellitus with hyperglycemia, with long-term current use of insulin Hgb A1c today, worse when compared to December when it was 7.2.  Patient is under significant stress given that her daughter is terminally ill.  She has been stress eating.  - HgB A1c, next in 3 months - Increase Jardiance to 25 mg daily - Continue 10U insulin daily - Continue 1000 mg Metformin BID  - Continue statin  -She is interested in CGM: I recommend follow-up with Dr. Raymondo Band in 1  month to discuss and diabetes follow up - empagliflozin (JARDIANCE) 25 MG TABS tablet; Take 1 tablet (25 mg total) by mouth daily.  Dispense: 90 tablet; Refill: 0  2. Itching of both hands Without evidence of rash. Primarily between fingers. No evidence of scabies. Could have some component of contact dermatitis.  - triamcinolone (KENALOG) 0.025 % ointment; Apply 1 Application topically 2 (two) times daily.  Dispense: 30 g; Refill: 0 -Follow-up if no improvement  3. Acute pain of both shoulders With normal strength, so doubt rotator cuff tear. Differential includes bursitis vs tendonitis. Likely from overuse. No  injury or trauma or point tenderness to think fracture. Without any swelling. Low suspicion for polymyalgia rheumatica at this time.  Examination not consistent with frozen shoulder. Will trial conservative treatment. - meloxicam (MOBIC) 15 MG tablet; Take 1 tablet (15 mg total) by mouth daily.  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 Stress Reports that her daughter has heart and lung problems, not a candidate for transplant.  She is her caretaker.  Reports that there are no other medications or treatments available for her.  She was very tearful when discussing this.  She is undergoing significant stress and fears losing her daughter.  She relies on her spirituality to help her.  Not interested in other treatment options at this time to help (therapy, medications for depression, etc).   Sabino Dick, DO [facility]

## 2022-08-24 ENCOUNTER — Encounter: Payer: Self-pay | Admitting: Family Medicine

## 2022-08-24 ENCOUNTER — Ambulatory Visit: Payer: BC Managed Care – PPO | Admitting: Family Medicine

## 2022-08-24 ENCOUNTER — Other Ambulatory Visit: Payer: Self-pay

## 2022-08-24 VITALS — BP 108/80 | HR 75 | Ht 61.0 in | Wt 169.4 lb

## 2022-08-24 DIAGNOSIS — E1165 Type 2 diabetes mellitus with hyperglycemia: Secondary | ICD-10-CM

## 2022-08-24 DIAGNOSIS — F439 Reaction to severe stress, unspecified: Secondary | ICD-10-CM | POA: Diagnosis not present

## 2022-08-24 DIAGNOSIS — M25512 Pain in left shoulder: Secondary | ICD-10-CM

## 2022-08-24 DIAGNOSIS — L299 Pruritus, unspecified: Secondary | ICD-10-CM

## 2022-08-24 DIAGNOSIS — Z794 Long term (current) use of insulin: Secondary | ICD-10-CM | POA: Diagnosis not present

## 2022-08-24 DIAGNOSIS — M25511 Pain in right shoulder: Secondary | ICD-10-CM

## 2022-08-24 LAB — POCT GLYCOSYLATED HEMOGLOBIN (HGB A1C): HbA1c, POC (controlled diabetic range): 8.3 % — AB (ref 0.0–7.0)

## 2022-08-24 MED ORDER — EMPAGLIFLOZIN 25 MG PO TABS: 25.0000 mg | ORAL_TABLET | Freq: Every day | ORAL | 0 refills | Status: DC

## 2022-08-24 MED ORDER — TRIAMCINOLONE ACETONIDE 0.025 % EX OINT: 1.0000 | TOPICAL_OINTMENT | Freq: Two times a day (BID) | CUTANEOUS | 0 refills | Status: DC

## 2022-08-24 MED ORDER — MELOXICAM 15 MG PO TABS: 15.0000 mg | ORAL_TABLET | Freq: Every day | ORAL | 0 refills | Status: DC

## 2022-08-29 ENCOUNTER — Other Ambulatory Visit: Payer: Self-pay

## 2022-08-29 MED ORDER — ACCU-CHEK SOFTCLIX LANCETS MISC: 12 refills | Status: DC

## 2022-08-29 MED ORDER — ACCU-CHEK SOFTCLIX LANCETS MISC: 0 refills | Status: DC

## 2022-09-06 ENCOUNTER — Other Ambulatory Visit: Payer: Self-pay | Admitting: *Deleted

## 2022-09-06 MED ORDER — ACCU-CHEK GUIDE VI STRP: ORAL_STRIP | 12 refills | Status: DC

## 2022-09-10 ENCOUNTER — Other Ambulatory Visit: Payer: Self-pay | Admitting: Family Medicine

## 2022-09-10 DIAGNOSIS — L299 Pruritus, unspecified: Secondary | ICD-10-CM

## 2022-09-10 DIAGNOSIS — M25511 Pain in right shoulder: Secondary | ICD-10-CM

## 2022-09-13 ENCOUNTER — Other Ambulatory Visit: Payer: Self-pay | Admitting: Family Medicine

## 2022-09-15 ENCOUNTER — Encounter: Payer: Self-pay | Admitting: Pharmacist

## 2022-09-15 ENCOUNTER — Other Ambulatory Visit: Payer: Self-pay

## 2022-09-15 ENCOUNTER — Ambulatory Visit: Payer: BC Managed Care – PPO | Admitting: Pharmacist

## 2022-09-15 VITALS — BP 111/71 | HR 86 | Ht 61.42 in | Wt 172.2 lb

## 2022-09-15 DIAGNOSIS — E119 Type 2 diabetes mellitus without complications: Secondary | ICD-10-CM

## 2022-09-15 MED ORDER — FREESTYLE LIBRE 3 SENSOR MISC
11 refills | Status: DC
Start: 2022-09-15 — End: 2023-05-23
  Filled 2022-09-15: qty 2, 28d supply, fill #0
  Filled 2022-10-06: qty 2, 28d supply, fill #1
  Filled 2022-12-22: qty 2, 28d supply, fill #2

## 2022-09-15 NOTE — Progress Notes (Signed)
S:    Chief Complaint  Patient presents with   Medication Management    Diabetes management and CGM initiation   47 y.o. female who presents for diabetes evaluation, education, and management.  PMH is significant for T2DM, HTN, and HLD.  Patient was referred and last seen by Primary Care Provider, Dr. Melba Coon, on 08/24/2022.   At last visit, A1c was elevated at 8.3%, up from 7.2%. Jardiance was increased from 10 to 25 mg daily. Patient expressed interest in obtaining CGM.    Today, patient arrives in good spirits and presents with an in-person interpretor, Y Hin. Patient attributed elevated A1c to being out of medications for a while but she has since been able to restart medications without issue.   Family/Social History: no pertinent family history  Current diabetes medications include: Lantus (insulin glargine) 10 units daily, Jardiance (empagliflozin) 25 mg daily, metformin 1000 mg BID, and Ozempic (semaglutide) 2 mg weekly Current hypertension medications include: losartan-HCTZ 50-12.5 mg daily Current hyperlipidemia medications include: rosuvastatin 10 mg daily  Patient reports adherence to taking all medications as prescribed.   Insurance coverage: BCBS  Patient denies hypoglycemic events.  Reported home fasting blood sugars: 130s   Patient denies nocturia (nighttime urination).  Patient denies neuropathy (nerve pain). Patient denies visual changes. Patient reports self foot exams.    O:  Review of Systems  All other systems reviewed and are negative.   Physical Exam Constitutional:      Appearance: Normal appearance.  Pulmonary:     Effort: Pulmonary effort is normal.  Neurological:     Mental Status: She is alert.  Psychiatric:        Mood and Affect: Mood normal.        Behavior: Behavior normal.        Thought Content: Thought content normal.     Lab Results  Component Value Date   HGBA1C 8.3 (A) 08/24/2022   There were no vitals filed for  this visit.  Lipid Panel     Component Value Date/Time   CHOL 135 04/20/2022 1105   TRIG 83 04/20/2022 1105   HDL 47 04/20/2022 1105   CHOLHDL 2.9 04/20/2022 1105   LDLCALC 72 04/20/2022 1105    Clinical Atherosclerotic Cardiovascular Disease (ASCVD): No  The 10-year ASCVD risk score (Arnett DK, et al., 2019) is: 1.5%   Values used to calculate the score:     Age: 52 years     Sex: Female     Is Non-Hispanic African American: No     Diabetic: Yes     Tobacco smoker: No     Systolic Blood Pressure: 108 mmHg     Is BP treated: Yes     HDL Cholesterol: 47 mg/dL     Total Cholesterol: 135 mg/dL   A/P: Diabetes longstanding currently uncontrolled based on A1c. Patient is able to verbalize appropriate hypoglycemia management plan. Medication adherence appears appropriate. CGM - LIbre 3 placement and education done today, confirmed patient has compatible phone and assisted with app download and connection. -Continued basal insulin Lantus (insulin glargine) 10 units daily -Continued GLP-1 Ozempic (semaglutide) 2 mg weekly. -Continued SGLT2-I Jardiance (empagliflozin) 25 mg. Counseled on sick day rules. -Continued metformin 1000 mg BID. -FreeStyle Libre 3 sample sensor provided today. Placed and educated patient on sensor usage. Linked with [facility] LibreView. Sent in prescription to patient's pharmacy. Will follow up in 2 weeks to ensure patient was able to obtain CGM through insurance.  -Extensively discussed pathophysiology  of diabetes, recommended lifestyle interventions, dietary effects on blood sugar control.  -Counseled on s/sx of and management of hypoglycemia.  -Next A1c anticipated July 2024.   ASCVD risk - primary prevention in patient with diabetes. Last LDL is close to goal of <70 mg/dL. 10-year ASCVD risk score of 1.5%. moderate intensity statin indicated.  -Continued rosuvastatin 10 mg.   Hypertension longstanding currently controlled based on clinic BP. Blood pressure goal of  <130/80 mmHg. Medication adherence reported.  -Continued losartan-HCTZ 50-12.5 mg daily.  Written patient instructions provided. Patient verbalized understanding of treatment plan.  Total time in face to face counseling 40 minutes.    Follow-up:  Pharmacist 2 weeks. Patient seen with Bing Plume, PharmD Candidate.and Valeda Malm, PharmD, PGY2 Pharmacy Resident.

## 2022-09-15 NOTE — Patient Instructions (Addendum)
Khng c thay ??i thu?c.  Mang theo c?m bi?n c?a b?n ??n l?n khm ti?p theo.   ?ng d?ng c?m bi?n N?u s? d?ng ?ng d?ng, b?n c th? nh?n Tr? gip trong Menu chnh ?? truy c?p h??ng d?n trong ?ng d?ng v? cch p d?ng C?m bi?n. Xem bn d??i ?? bi?t h??ng d?n v? cch t?i xu?ng ?ng d?ng. 1. Ch? p d?ng C?m bi?n ? m?t sau c?a cnh tay trn c?a b?n. N?u ???c ??t ? cc khu v?c khc, C?m bi?n c th? khng ho?t ??ng bnh th??ng v c th? cho b?n k?t qu? ??c khng chnh xc. Trnh nh?ng vng c s?o, n?t ru?i, v?t r?n da ho?c c?c u.  1. Ch?n m?t vng da th??ng ph?ng trong cc ho?t ??ng bnh th??ng hng ngy c?a b?n (khng b? cong ho?c g?p). Ch?n v? tr cch xa b?t k? v? tr tim no t nh?t 1 inch (2,5 cm). ?? trnh c?m gic kh ch?u ho?c kch ?ng da, b?n nn ch?n m?t trang web khc ngoi trang ???c s? d?ng g?n ?y nh?t. 2. R?a s?ch v? tr bi thu?c b?ng x phng thng th??ng, lau kh v sau ? lau s?ch b?ng kh?n lau c?n. ?i?u ny s? gip lo?i b? c?n d?u c th? khi?n c?m bi?n khng th? bm dnh ?ng cch. Cho php trang web kh t? nhin tr??c khi ti?p t?c. L?u : Khu v?c PH?I s?ch s? v kh ro, n?u khng C?m bi?n c th? khng ho?t ??ng trong su?t th?i gian hao mn ???c ch? ??nh b?i b? ph?n chn C?m bi?n c?a b?n. 4. Tho n?p kh?i B? c?m bi?n v ??t n?p sang m?t bn. 5. ??t D?ng c? c?m bi?n ln v? tr ? chu?n b? s?n v ?n m?nh xu?ng ?? p d?ng C?m bi?n vo c? th? b?n. 6. Nh? nhng ko Thi?t b? c?m bi?n ra kh?i c? th? b?n. C?m bi?n by gi? s? ???c g?n vo da c?a b?n. 7. ??m b?o C?m bi?n ???c an ton sau khi s? d?ng. ??y n?p l?i B? c?m bi?n. V?t b? D?ng c? c?m bi?n ? s? d?ng theo quy ??nh c?a ??a ph??ng.  ?i?u g x?y ra n?u c?m bi?n c?a ti b? r?i ho?c n?u c?m bi?n c?a ti khng ho?t ??ng th sao? Hy g?i cho Nhm ch?m [employer] khch hng c?a [employer] theo s? [phone] C s?n 7 ngy m?t tu?n t? 8 gi? sng ??n 8 gi? t?i theo gi? EST, tr? ngy l?   The App T?i xu?ng ?ng d?ng FreeStyle Libre 3 trong c?a hng ?ng  d?ng trn ?i?n tho?i c?a b?n   2. T?i ?ng d?ng v ch?n b?t ??u ngay 3. T?o ti kho?n 4. Nh?n vo qut c?m bi?n m?i 5. Lm theo l?i nh?c trn mn hnh. N?u c?m bi?n c?a b?n khng ??ng b? ha, hy th? di chuy?n ?i?n tho?i c?a b?n t? t? xung quanh c?m bi?n. V? ?i?n tho?i c th? ?nh h??ng ??n qu trnh qut. ?y s? l l?n duy nh?t b?n ph?i qut c?m bi?n cho ??n khi p d?ng c?m bi?n m?i trong 14 ngy. 6. S? c kho?ng th?i gian kh?i ??ng l 60 pht cho ??n khi ?ng d?ng hi?n th? ch? s? glucose c?a b?n

## 2022-09-15 NOTE — Assessment & Plan Note (Signed)
Diabetes longstanding currently uncontrolled based on A1c. Patient is able to verbalize appropriate hypoglycemia management plan. Medication adherence appears appropriate. CGM - LIbre 3 placement and education done today, confirmed patient has compatible phone and assisted with app download and connection. -Continued basal insulin Lantus (insulin glargine) 10 units daily -Continued GLP-1 Ozempic (semaglutide) 2 mg weekly. -Continued SGLT2-I Jardiance (empagliflozin) 25 mg. Counseled on sick day rules. -Continued metformin 1000 mg BID. -FreeStyle Libre 3 sample sensor provided today. Placed and educated patient on sensor usage. Linked with [facility] LibreView. Sent in prescription to patient's pharmacy. Will follow up in 2 weeks to ensure patient was able to obtain CGM through insurance.  -Extensively discussed pathophysiology of diabetes, recommended lifestyle interventions, dietary effects on blood sugar control.  -Counseled on s/sx of and management of hypoglycemia.

## 2022-09-19 NOTE — Progress Notes (Signed)
Reviewed and agree with Dr Koval's plan.   

## 2022-09-19 NOTE — Telephone Encounter (Signed)
Reviewed and agree with Dr Koval's plan.   

## 2022-09-20 NOTE — Telephone Encounter (Signed)
Reviewed and agree with Dr Koval's plan.   

## 2022-09-29 ENCOUNTER — Ambulatory Visit: Payer: BC Managed Care – PPO | Admitting: Pharmacist

## 2022-09-29 ENCOUNTER — Encounter: Payer: Self-pay | Admitting: Pharmacist

## 2022-09-29 VITALS — BP 107/72 | HR 77 | Ht 62.6 in | Wt 168.4 lb

## 2022-09-29 DIAGNOSIS — E119 Type 2 diabetes mellitus without complications: Secondary | ICD-10-CM | POA: Diagnosis not present

## 2022-09-29 MED ORDER — INSULIN LISPRO 100 UNIT/ML IJ SOLN
3.0000 [IU] | Freq: Two times a day (BID) | INTRAMUSCULAR | 11 refills | Status: DC
Start: 2022-09-29 — End: 2022-10-04

## 2022-09-29 NOTE — Progress Notes (Signed)
S:    Chief Complaint  Patient presents with   Medication Management    T2DM and CGM f/u   47 y.o. female who presents for diabetes evaluation, education, and management.  PMH is significant for T2DM, HTN, and HLD.  Patient was referred and last seen by Primary Care Provider, Dr. Melba Coon, on 08/24/2022.    At last visit, patient was seen by the pharmacy team on 09/15/2022 and CGM was initiated.   Today, patient arrives in good good spirits and presents without any assistance. Clinic visit conducted with assistance from remote interpreter (ID: [phone] and [phone])  Current diabetes medications include: empagliflozin (JARDIANCE) 25 MG daily, insulin glargine (LANTUS) 10 units in the morning, metFORMIN (GLUCOPHAGE) 1000 MG tablet twice daily, semaglutide (Ozempic) 2 mg weekly Current hypertension medications include: losartan-HCTZ 50-12.5 mg daily Current hyperlipidemia medications include: rosuvastatin 10 mg daily   Patient reports adherence to taking all medications as prescribed.   Insurance coverage: BCBS  Patient denies hypoglycemic events.  Patient denies nocturia (nighttime urination).  Patient denies neuropathy (nerve pain). Patient denies visual changes. Patient reports self foot exams.    O:   Review of Systems  All other systems reviewed and are negative.   Physical Exam Constitutional:      Appearance: Normal appearance.  Pulmonary:     Effort: Pulmonary effort is normal.  Neurological:     Mental Status: She is alert.  Psychiatric:        Mood and Affect: Mood normal.        Behavior: Behavior normal.        Thought Content: Thought content normal.     CGM Download:  % Time CGM is active: 96% Average Glucose: 166 mg/dL Glucose Management Indicator: 7.3%  Glucose Variability: 37.5 (goal <36%) Time in Goal:  - Time in range 70-180: 65% - Time above range: 34% - Time below range: 1% Observed patterns:blood glucose peaks after lunch at 12:00 PM  (around 220 mmHg) and another peak after dinner at 9:00 PM (around 220-225 mmHg).    Lab Results  Component Value Date   HGBA1C 8.3 (A) 08/24/2022   Vitals:   09/29/22 1043  BP: 107/72  Pulse: 77  SpO2: 100%    Lipid Panel     Component Value Date/Time   CHOL 135 04/20/2022 1105   TRIG 83 04/20/2022 1105   HDL 47 04/20/2022 1105   CHOLHDL 2.9 04/20/2022 1105   LDLCALC 72 04/20/2022 1105    Clinical Atherosclerotic Cardiovascular Disease (ASCVD): No  The 10-year ASCVD risk score (Arnett DK, et al., 2019) is: 1.5%   Values used to calculate the score:     Age: 12 years     Sex: Female     Is Non-Hispanic African American: No     Diabetic: Yes     Tobacco smoker: No     Systolic Blood Pressure: 107 mmHg     Is BP treated: Yes     HDL Cholesterol: 47 mg/dL     Total Cholesterol: 135 mg/dL    A/P: Diabetes longstanding slightly improved with an average glucose of 166 mg/dL per CGM report and peaks over target range (>180 mmHg) after lunch and dinner. Patient is able to verbalize appropriate hypoglycemia management plan. Medication adherence appears optimal.  -Continued empagliflozin (JARDIANCE) 25 MG daily, insulin glargine (LANTUS) 10 units in the morning, metFORMIN (GLUCOPHAGE) 1000 MG tablet twice daily, and semaglutide (Ozempic) 2 mg weekly. -Initiated insulin lispro (Humalog) 3 units 2  times daily before lunch and before dinner.  -Educated patient on proper CGM application. -Encouraged patient to improved and make changes to her diet, focusing on lowering carbohydrate-rich foods and increasing fiber, vegetables, and fresh fruit consumption -Extensively discussed pathophysiology of diabetes, recommended lifestyle interventions, dietary effects on blood sugar control.  -Counseled on s/sx of and management of hypoglycemia.  -Next A1c anticipated July 2024.    Written patient instructions provided. Patient verbalized understanding of treatment plan.  Total time in face  to face counseling 26 minutes.    Follow-up:  Pharmacist 1 month. At this point, no scheduled appointment with PCP.  Patient seen with Bing Plume, PharmD Candidate.and Valeda Malm, PharmD, PGY2 Pharmacy Resident.

## 2022-09-29 NOTE — Assessment & Plan Note (Signed)
Diabetes longstanding slightly improved with an average glucose of 166 mg/dL per CGM report and peaks over target range (>180 mmHg) after lunch and dinner. Patient is able to verbalize appropriate hypoglycemia management plan. Medication adherence appears optimal.  -Continued empagliflozin (JARDIANCE) 25 MG daily, insulin glargine (LANTUS) 10 units in the morning, metFORMIN (GLUCOPHAGE) 1000 MG tablet twice daily, and semaglutide (Ozempic) 2 mg weekly. -Initiated insulin lispro (Humalog) 3 units 2 times daily before lunch and before dinner.  -Educated patient on proper CGM application. -Encouraged patient to improved and make changes to her diet, focusing on lowering carbohydrate-rich foods and increasing fiber, vegetables, and fresh fruit consumption -Extensively discussed pathophysiology of diabetes, recommended lifestyle interventions, dietary effects on blood sugar control.  -Counseled on s/sx of and management of hypoglycemia.

## 2022-09-29 NOTE — Progress Notes (Signed)
Reviewed and agree with Dr Koval's plan.   

## 2022-09-29 NOTE — Patient Instructions (Addendum)
Th?t vui ???c g?p b?n ngy hm nay!  L??ng ???ng trong mu m?c tiu c?a b?n l 80-130 tr??c khi ?n v d??i 180 sau khi ?n.  T? bo co CGM c?a b?n, l??ng ???ng trong mu trung bnh c?a b?n l 166 mg/dL.  Thay ??i thu?c: B?t ??u Humalog 3 ??n v? tr??c b?a tr?a v b?a t?i Ti?p t?c Lantus 10 ??n v? m?i ngy, Ozempic 2 mg m?i ngy v metformin.  Hy ti?p t?c cng vi?c t?t v?i ch? ?? ?n king v t?p th? d?c. H??ng t?i ch? ?? ?n nhi?u rau, tri cy v th?t n?c (g, g ty, c). C? g?ng h?n ch? ?n mu?i b?ng cch ?n rau t??i ho?c ?ng l?nh (thay v ?ng h?p), r?a s?ch rau ?ng h?p tr??c khi n?u v khng thm mu?i vo b?a ?n.  ----------------------------------------------------------------------------------------------------------------------------------  It was nice to see you today!  Your goal blood sugar is 80-130 before eating and less than 180 after eating.  From your CGM report, your average blood sugar is 166 mg/dL.  Medication Changes: Start Humalog 3 units before lunch and dinner Continue Lantus 10 units daily, Ozempic 2 mg daily, and metformin.   Keep up the good work with diet and exercise. Aim for a diet full of vegetables, fruit and lean meats (chicken, [place], fish). Try to limit salt intake by eating fresh or frozen vegetables (instead of canned), rinse canned vegetables prior to cooking and do not add any additional salt to meals.

## 2022-10-04 ENCOUNTER — Other Ambulatory Visit (HOSPITAL_COMMUNITY): Payer: Self-pay

## 2022-10-04 ENCOUNTER — Telehealth: Payer: Self-pay

## 2022-10-04 ENCOUNTER — Other Ambulatory Visit: Payer: Self-pay | Admitting: Family Medicine

## 2022-10-04 DIAGNOSIS — E119 Type 2 diabetes mellitus without complications: Secondary | ICD-10-CM

## 2022-10-04 MED ORDER — INSULIN LISPRO 100 UNIT/ML IJ SOLN
3.0000 [IU] | Freq: Two times a day (BID) | INTRAMUSCULAR | 11 refills | Status: DC
Start: 2022-10-04 — End: 2022-10-04
  Filled 2022-10-04: qty 10, 167d supply, fill #0

## 2022-10-04 MED ORDER — INSULIN LISPRO 100 UNIT/ML IJ SOLN
3.0000 [IU] | Freq: Two times a day (BID) | INTRAMUSCULAR | 11 refills | Status: DC
Start: 2022-10-04 — End: 2022-10-27

## 2022-10-04 NOTE — Telephone Encounter (Signed)
[facility] faxed over form stating they only have HUMALOG available in 10 mL, please make the adjustments. Thank you. Penni Bombard CMA

## 2022-10-04 NOTE — Telephone Encounter (Signed)
Rx sent 

## 2022-10-06 ENCOUNTER — Other Ambulatory Visit: Payer: Self-pay

## 2022-10-06 ENCOUNTER — Other Ambulatory Visit: Payer: Self-pay | Admitting: Family Medicine

## 2022-10-07 ENCOUNTER — Other Ambulatory Visit: Payer: Self-pay

## 2022-10-07 MED ORDER — METFORMIN HCL 1000 MG PO TABS
1000.0000 mg | ORAL_TABLET | Freq: Two times a day (BID) | ORAL | 1 refills | Status: DC
  Filled 2022-10-07 – 2022-10-10 (×2): qty 180, 90d supply, fill #0

## 2022-10-10 ENCOUNTER — Other Ambulatory Visit: Payer: Self-pay

## 2022-10-10 ENCOUNTER — Other Ambulatory Visit: Payer: Self-pay | Admitting: Family Medicine

## 2022-10-27 ENCOUNTER — Encounter: Payer: Self-pay | Admitting: Pharmacist

## 2022-10-27 ENCOUNTER — Ambulatory Visit: Payer: BC Managed Care – PPO | Admitting: Pharmacist

## 2022-10-27 ENCOUNTER — Other Ambulatory Visit: Payer: Self-pay

## 2022-10-27 VITALS — BP 113/79 | HR 74 | Ht 61.0 in | Wt 169.6 lb

## 2022-10-27 DIAGNOSIS — E119 Type 2 diabetes mellitus without complications: Secondary | ICD-10-CM

## 2022-10-27 DIAGNOSIS — E785 Hyperlipidemia, unspecified: Secondary | ICD-10-CM

## 2022-10-27 DIAGNOSIS — E1165 Type 2 diabetes mellitus with hyperglycemia: Secondary | ICD-10-CM

## 2022-10-27 DIAGNOSIS — I10 Essential (primary) hypertension: Secondary | ICD-10-CM | POA: Diagnosis not present

## 2022-10-27 DIAGNOSIS — E1169 Type 2 diabetes mellitus with other specified complication: Secondary | ICD-10-CM | POA: Diagnosis not present

## 2022-10-27 DIAGNOSIS — Z794 Long term (current) use of insulin: Secondary | ICD-10-CM

## 2022-10-27 MED ORDER — LOSARTAN POTASSIUM-HCTZ 50-12.5 MG PO TABS
1.0000 | ORAL_TABLET | Freq: Every day | ORAL | 3 refills | Status: DC
Start: 2022-10-27 — End: 2023-06-21
  Filled 2022-10-27: qty 90, 90d supply, fill #0

## 2022-10-27 MED ORDER — EMPAGLIFLOZIN 25 MG PO TABS
25.0000 mg | ORAL_TABLET | Freq: Every day | ORAL | 3 refills | Status: DC
Start: 2022-10-27 — End: 2023-02-24
  Filled 2022-10-27: qty 90, 90d supply, fill #0

## 2022-10-27 MED ORDER — INSULIN GLARGINE 100 UNIT/ML SOLOSTAR PEN
8.0000 [IU] | PEN_INJECTOR | SUBCUTANEOUS | 11 refills | Status: DC
Start: 2022-10-27 — End: 2023-02-24
  Filled 2022-10-27: qty 3, 28d supply, fill #0

## 2022-10-27 MED ORDER — INSULIN LISPRO (1 UNIT DIAL) 100 UNIT/ML (KWIKPEN)
4.0000 [IU] | PEN_INJECTOR | Freq: Two times a day (BID) | SUBCUTANEOUS | 11 refills | Status: DC
Start: 2022-10-27 — End: 2023-09-04
  Filled 2022-10-27 (×2): qty 6, 75d supply, fill #0

## 2022-10-27 MED ORDER — ROSUVASTATIN CALCIUM 10 MG PO TABS
10.0000 mg | ORAL_TABLET | Freq: Every day | ORAL | 1 refills | Status: DC
Start: 2022-10-27 — End: 2023-04-18
  Filled 2022-10-27: qty 90, 90d supply, fill #0

## 2022-10-27 NOTE — Patient Instructions (Addendum)
Th?t vui ???c g?p b?n ngy hm nay!  L??ng ???ng trong mu m?c tiu c?a b?n l 80-130 tr??c khi ?n v d??i 180 sau khi ?n.  Thay ??i thu?c: Ti?p t?c Jardiance 25 mg m?i ngy v metformin 1000 mg hai l?n m?i ngy  T?ng Humalog ln 4 ??n v? hai l?n m?i ngy trong b?a ?n  Gi?m Lantus xu?ng 8 ??n v? m?i ngy trong b?a ?n  Theo di l??ng ???ng trong mu t?i nh v gi? m?t cu?n nh?t k (my ?o ???ng huy?t ho?c m?nh gi?y) ?? mang theo trong l?n khm ti?p theo.  Hy ti?p t?c cng vi?c t?t v?i ch? ?? ?n king v t?p th? d?c. H??ng t?i ch? ?? ?n nhi?u rau, tri cy v th?t n?c (g, g ty, c). C? g?ng h?n ch? ?n mu?i b?ng cch ?n rau t??i ho?c ?ng l?nh (thay v ?ng h?p), r?a s?ch rau ?ng h?p tr??c khi n?u v khng thm mu?i vo b?a ?n.  It was nice to see you today!  Your goal blood sugar is 80-130 before eating and less than 180 after eating.  Medication Changes: Continue Jardiance 25 mg daily and metformin 1000 mg twice daily  Increase Humalog to 4 units twice daily with meals  Decrease Lantus to 8 units daily with meals  Monitor blood sugars at home and keep a log (glucometer or piece of paper) to bring with you to your next visit.  Keep up the good work with diet and exercise. Aim for a diet full of vegetables, fruit and lean meats (chicken, [place], fish). Try to limit salt intake by eating fresh or frozen vegetables (instead of canned), rinse canned vegetables prior to cooking and do not add any additional salt to meals.

## 2022-10-27 NOTE — Assessment & Plan Note (Signed)
Diabetes longstanding currently close to goal based on LibreView Report. Most consistent trends include postprandial hyperglycemia and overnight blood glucose reductions. Patient is able to verbalize appropriate hypoglycemia management plan. Medication adherence appears appropriate, aside from the [place]. Unable to tell if she has been taking this.  -Decreased dose of basal insulin Lantus (insulin glargine) from 10 to 8 units daily -Increased dose of rapid insulin Humalog (insulin lispro) from 3 to 4 units BID with meals -Continued SGLT2-I Jardiance (empagliflozin) 25 mg. New prescription sent to the pharmacy -Continued metformin 1000 mg BID.  -Patient educated on purpose, proper use, and potential adverse effects of medications.  -Extensively discussed pathophysiology of diabetes, recommended lifestyle interventions, dietary effects on blood sugar control.  -Counseled on s/sx of and management of hypoglycemia.  -Next A1c anticipated 12/2022.

## 2022-10-27 NOTE — Progress Notes (Signed)
S:     Chief Complaint  Patient presents with   Medication Management    Diabetes   47 y.o. female who presents for diabetes evaluation, education, and management.  PMH is significant for T2DM, HTN, and HLD.  Patient was referred and last seen by Primary Care Provider, Dr. Melba Coon, on 08/24/2022.    At last visit, patient was seen by the pharmacy team on 09/29/22, she was started on Humalog 3 units BID with meals.   Today, patient arrives in good good spirits and presents without any assistance. She is planning to leave for [place] soon. Her daughter is currently hospitalized there with a heart condition. Reports that brown rice is one thing that causes increased BG readings.  Current diabetes medications include: empagliflozin (Jardiance) 25 MG daily, insulin glargine (Lantus) 10 units in the morning, Humalog (insulin lispro) 3 units BID with meals, metformin 1000 MG tablet twice daily, semaglutide (Ozempic) 2 mg weekly Current hypertension medications include: losartan-HCTZ 50-12.5 mg daily Current hyperlipidemia medications include: rosuvastatin 10 mg daily   Patient reports adherence to taking all medications as prescribed. She was unable to confirm adherence to [place] today.  Insurance coverage: BCBS  Patient denies hypoglycemic events.  Patient denies nocturia (nighttime urination).  Patient denies neuropathy (nerve pain). Patient denies visual changes. Patient reports self foot exams.   Diet: - Endorses regular vegetable intake from her garden (corn, cucumber, potatoes, cabbage) Drinks: coffee with sugar and water  O:   Review of Systems  All other systems reviewed and are negative.   Physical Exam Vitals reviewed.  Constitutional:      Appearance: Normal appearance. She is normal weight.  Pulmonary:     Effort: Pulmonary effort is normal.     Breath sounds: Normal breath sounds.  Neurological:     Mental Status: She is alert.  Psychiatric:         Mood and Affect: Mood normal.        Behavior: Behavior normal.        Thought Content: Thought content normal.        Judgment: Judgment normal.     CGM Download:  % Time CGM is active: 96% Average Glucose: 159 mg/dL Glucose Management Indicator: 7.1%  Glucose Variability: 32.4 (goal <36%) Time in Goal:  - Time in range 70-180: 69% - Time above range: 31% - Time below range: 0% Observed patterns:blood glucose peaks after lunch and again after dinner with a bit of blood glucose reductions overnight (not concerning for hypoglycemia at this time).   Lab Results  Component Value Date   HGBA1C 8.3 (A) 08/24/2022   Vitals:   10/27/22 1132  BP: 113/79  Pulse: 74  SpO2: 100%    Lipid Panel     Component Value Date/Time   CHOL 135 04/20/2022 1105   TRIG 83 04/20/2022 1105   HDL 47 04/20/2022 1105   CHOLHDL 2.9 04/20/2022 1105   LDLCALC 72 04/20/2022 1105    Clinical Atherosclerotic Cardiovascular Disease (ASCVD): No  The 10-year ASCVD risk score (Arnett DK, et al., 2019) is: 1.6%   Values used to calculate the score:     Age: 50 years     Sex: Female     Is Non-Hispanic African American: No     Diabetic: Yes     Tobacco smoker: No     Systolic Blood Pressure: 113 mmHg     Is BP treated: Yes     HDL Cholesterol: 47 mg/dL  Total Cholesterol: 135 mg/dL   A/P: Diabetes longstanding currently close to goal based on LibreView Report. Most consistent trends include postprandial hyperglycemia and overnight blood glucose reductions. Patient is able to verbalize appropriate hypoglycemia management plan. Medication adherence appears appropriate, aside from the [place]. Unable to tell if she has been taking this.  -Decreased dose of basal insulin Lantus (insulin glargine) from 10 to 8 units daily -Increased dose of rapid insulin Humalog (insulin lispro) from 3 to 4 units BID with meals -Continued SGLT2-I Jardiance (empagliflozin) 25 mg. New prescription sent to the  pharmacy -Continued metformin 1000 mg BID.  -Patient educated on purpose, proper use, and potential adverse effects of medications.  -Extensively discussed pathophysiology of diabetes, recommended lifestyle interventions, dietary effects on blood sugar control.  -Counseled on s/sx of and management of hypoglycemia.  -Next A1c anticipated 12/2022.   ASCVD risk - primary prevention in patient with diabetes. Last LDL is 72, close to goal of <70 mg/dL.  -Continued rosuvastatin 10 mg.  - Refill sent to her pharmacy  Hypertension longstanding currently controlled. Blood pressure goal of <130/80 mmHg. Medication adherence optimal -Continued losartan/hydrochlorothiazide 50/12.5 mg. - Refill sent to her pharmacy  Written patient instructions provided. Patient verbalized understanding of treatment plan.  Total time in face to face counseling 32 minutes.    Follow-up:  Pharmacist 12/15/22 Patient seen with Lily Peer, PharmD, PGY-1 resident.

## 2022-10-28 NOTE — Progress Notes (Signed)
Reviewed and agree with Dr Koval's plan.   

## 2022-11-07 ENCOUNTER — Other Ambulatory Visit: Payer: Self-pay | Admitting: Family Medicine

## 2022-11-08 ENCOUNTER — Other Ambulatory Visit: Payer: Self-pay

## 2022-11-08 ENCOUNTER — Other Ambulatory Visit (HOSPITAL_COMMUNITY): Payer: Self-pay

## 2022-11-08 ENCOUNTER — Other Ambulatory Visit: Payer: Self-pay | Admitting: Family Medicine

## 2022-11-11 ENCOUNTER — Other Ambulatory Visit: Payer: Self-pay

## 2022-11-11 NOTE — Telephone Encounter (Signed)
Morgan Osborne -  Is there something I need to do with the requested for semaglutide. Huey Bienenstock.

## 2022-12-01 ENCOUNTER — Ambulatory Visit: Payer: BC Managed Care – PPO | Admitting: Pharmacist

## 2022-12-15 ENCOUNTER — Ambulatory Visit: Payer: BC Managed Care – PPO | Admitting: Pharmacist

## 2022-12-15 ENCOUNTER — Encounter: Payer: Self-pay | Admitting: Pharmacist

## 2022-12-15 VITALS — BP 113/85 | HR 76 | Ht 61.5 in | Wt 172.4 lb

## 2022-12-15 DIAGNOSIS — E785 Hyperlipidemia, unspecified: Secondary | ICD-10-CM

## 2022-12-15 DIAGNOSIS — E119 Type 2 diabetes mellitus without complications: Secondary | ICD-10-CM | POA: Diagnosis not present

## 2022-12-15 DIAGNOSIS — I10 Essential (primary) hypertension: Secondary | ICD-10-CM | POA: Diagnosis not present

## 2022-12-15 DIAGNOSIS — E1169 Type 2 diabetes mellitus with other specified complication: Secondary | ICD-10-CM | POA: Diagnosis not present

## 2022-12-15 NOTE — Patient Instructions (Signed)
It was nice to see you today! Your blood sugars are looking great! Congratulations!  Your goal blood sugar is 80-130 before eating and less than 180 after eating.  Medication Changes: Continue all medication the same.   Keep up the good work with diet and exercise. Aim for a diet full of vegetables, fruit and lean meats (chicken, [place], fish). Try to limit salt intake by eating fresh or frozen vegetables (instead of canned), rinse canned vegetables prior to cooking and do not add any additional salt to meals.

## 2022-12-15 NOTE — Progress Notes (Signed)
Reviewed and agree with Dr Koval's plan.   

## 2022-12-15 NOTE — Assessment & Plan Note (Signed)
Diabetes longstanding currently at goal based on LibreView Report. - GMI of 6.6  Most consistent trends include modest postprandial hyperglycemia and lower overnight blood glucose. Patient is able to verbalize appropriate hypoglycemia management plan. Medication adherence appears appropriate.  -Continued dose of basal insulin Lantus (insulin glargine) 8 units daily -Continued dose of rapid insulin Humalog (insulin lispro) 4 units BID with meals -Continued SGLT2-I Jardiance (empagliflozin) 25 mg -Continued metformin 1000 mg BID

## 2022-12-15 NOTE — Assessment & Plan Note (Signed)
Hypertension longstanding currently controlled. Blood pressure goal of <130/80 mmHg. Medication adherence optimal -Continued losartan/hydrochlorothiazide 50/12.5 mg.

## 2022-12-15 NOTE — Assessment & Plan Note (Signed)
Hyperlipidemia - primary prevention in patient with diabetes. Last LDL is 72, close to goal of <70 mg/dL.  -Continued rosuvastatin 10 mg.

## 2022-12-15 NOTE — Progress Notes (Signed)
S:     Chief Complaint  Patient presents with   Medication Management    Diabetes follow up   47 y.o. female who presents for diabetes evaluation, education, and management. Patient arrives in good spirits and presents without any assistance.  She is excited to share that she PASSED her Citizenship "test" She was very proud to be a [place] citizen.   Patient was referred and last seen by Primary Care Provider, Dr. Melba Coon, on 10/04/22.  Patient was last seen in pharmacy clinic on 10/27/22.   PMH is significant for T2DM, HTN, HLD.  At last visit, decreased dose of basal insulin Lantus (insulin glargine) from 10 to 8 units daily, increased dose of rapid insulin Humalog (insulin lispro) from 3 to 4 units BID with meals, and continued SGLT2-I Jardiance (empagliflozin) 25 mg and metformin 1000 mg BID.   Family/Social History: Patient is planning on visiting   Current diabetes medications include: Jardiance (empagliflozin) 25 mg daily, metformin 1,000 mg twice daily, Ozempic (semaglutide) 2 mg once weekly, Lantus (insulin glargine) 8 units once daily, Humalog (insulin lispro) 4 units twice daily with meals Current hypertension medications include: losartan-hydrochlorothiazide 50 mg - 12.5 mg once daily Current hyperlipidemia medications include: rosuvastatin 10 mg daily  Patient reports adherence to taking all medications as prescribed.   Insurance coverage: BCBS  O:   Review of Systems  All other systems reviewed and are negative.   Physical Exam Constitutional:      Appearance: Normal appearance.  Pulmonary:     Effort: Pulmonary effort is normal.  Neurological:     Mental Status: She is alert.  Psychiatric:        Mood and Affect: Mood normal.        Behavior: Behavior normal.        Thought Content: Thought content normal.        Judgment: Judgment normal.    Libre3 CGM Download today  % Time CGM is active: 94% Average Glucose: 137 mg/dL Glucose Management Indicator:  6.6  Glucose Variability: 29.5% (goal <36%) Time in Goal:  - Time in range 70-180: 85% - Time above range: 15% - Time below range: 0% Observed patterns:   Lab Results  Component Value Date   HGBA1C 8.3 (A) 08/24/2022   Vitals:   12/15/22 1045  BP: 113/85  Pulse: 76  SpO2: 100%    Lipid Panel     Component Value Date/Time   CHOL 135 04/20/2022 1105   TRIG 83 04/20/2022 1105   HDL 47 04/20/2022 1105   CHOLHDL 2.9 04/20/2022 1105   LDLCALC 72 04/20/2022 1105    Clinical Atherosclerotic Cardiovascular Disease (ASCVD): No  The 10-year ASCVD risk score (Arnett DK, et al., 2019) is: 1.6%   Values used to calculate the score:     Age: 58 years     Sex: Female     Is Non-Hispanic African American: No     Diabetic: Yes     Tobacco smoker: No     Systolic Blood Pressure: 113 mmHg     Is BP treated: Yes     HDL Cholesterol: 47 mg/dL     Total Cholesterol: 135 mg/dL   A/P:  Diabetes longstanding currently at goal based on LibreView Report. - GMI of 6.6  Most consistent trends include modest postprandial hyperglycemia and lower overnight blood glucose. Patient is able to verbalize appropriate hypoglycemia management plan. Medication adherence appears appropriate.  -Continued dose of basal insulin Lantus (insulin glargine) 8  units daily -Continued dose of rapid insulin Humalog (insulin lispro) 4 units BID with meals -Continued SGLT2-I Jardiance (empagliflozin) 25 mg -Continued metformin 1000 mg BID   -Patient educated on purpose, proper use, and potential adverse effects. -Extensively discussed pathophysiology of diabetes, recommended lifestyle interventions, dietary effects on blood sugar control.  -Counseled on s/sx of and management of hypoglycemia.  -Most recent A1c completed on 08/24/22. Next A1c anticipated at follow-up.  Hyperlipidemia - primary prevention in patient with diabetes. Last LDL is 72, close to goal of <70 mg/dL.  -Continued rosuvastatin 10 mg.    Hypertension longstanding currently controlled. Blood pressure goal of <130/80 mmHg. Medication adherence optimal -Continued losartan/hydrochlorothiazide 50/12.5 mg.   Written patient instructions provided. Patient verbalized understanding of treatment plan.  Total time in face to face counseling 19 minutes.    Follow-up:  Pharmacist PRN Follow up to meet new PCP (to be determined) based on upcoming trip to [place] for 6 months. Patient seen with Rickey Primus, PharmD Candidate and Andee Poles, PharmD Candidate.

## 2022-12-22 ENCOUNTER — Other Ambulatory Visit: Payer: Self-pay

## 2022-12-26 ENCOUNTER — Other Ambulatory Visit: Payer: Self-pay

## 2023-01-02 ENCOUNTER — Other Ambulatory Visit: Payer: Self-pay

## 2023-01-24 ENCOUNTER — Other Ambulatory Visit: Payer: Self-pay

## 2023-02-01 ENCOUNTER — Other Ambulatory Visit: Payer: Self-pay

## 2023-02-11 IMAGING — US US ABDOMEN LIMITED RUQ/ASCITES
1 series · 14 of 25 positions shown · non-contrast
Comparison: None.

CLINICAL DATA: Right upper quadrant pain

EXAM:
ULTRASOUND ABDOMEN LIMITED RIGHT UPPER QUADRANT

[Series 1: us abdomen limited ruq/ascites · 0.26mm/px · 14 of 37 slices shown]
[im 1/37]
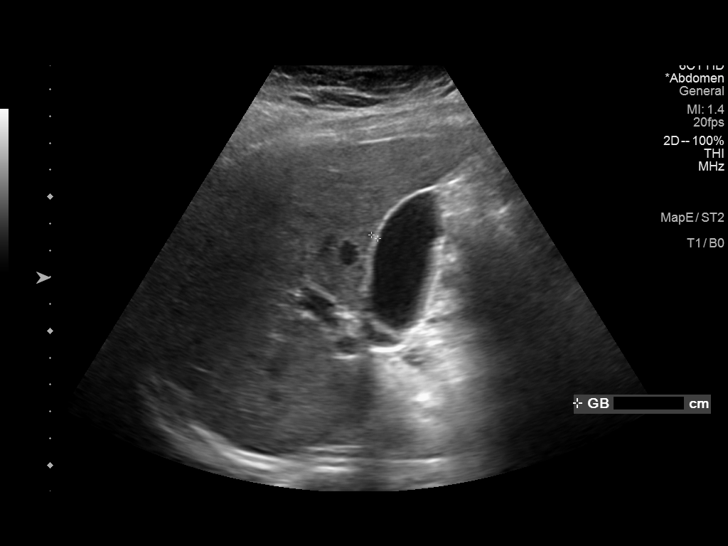
[im 4/37]
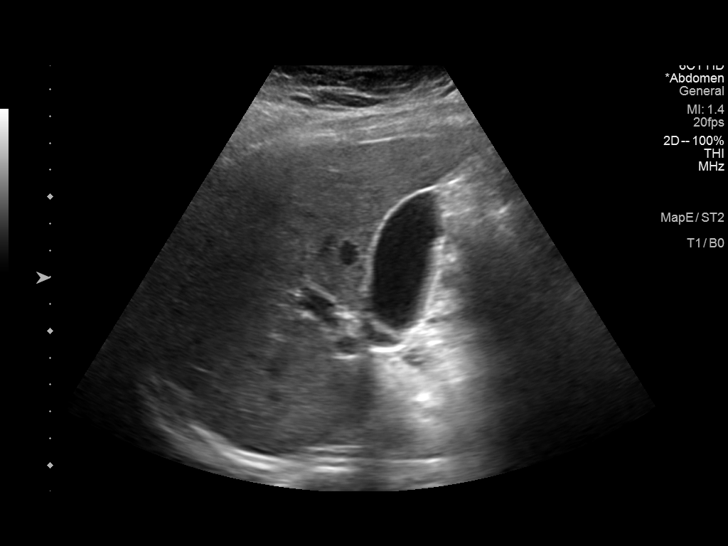
[im 7/37]
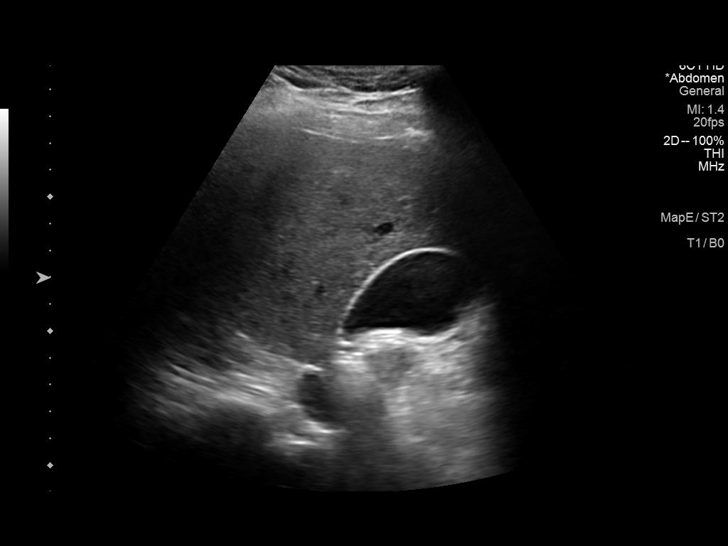
[im 10/37]
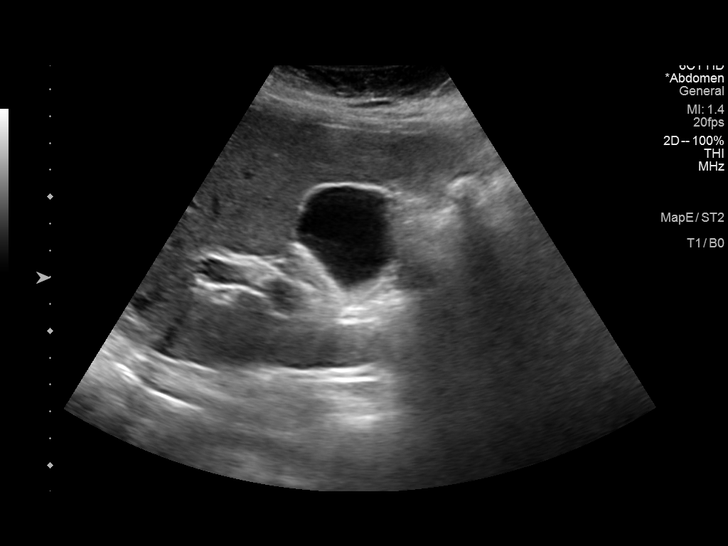
[im 13/37]
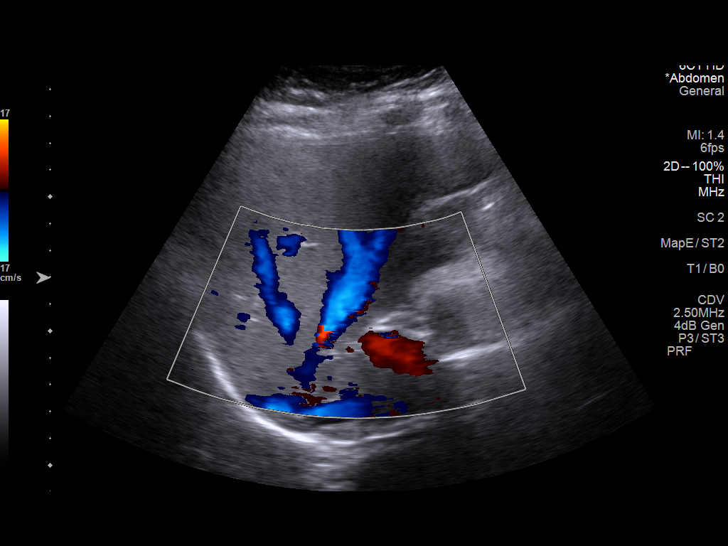
[im 14/37]
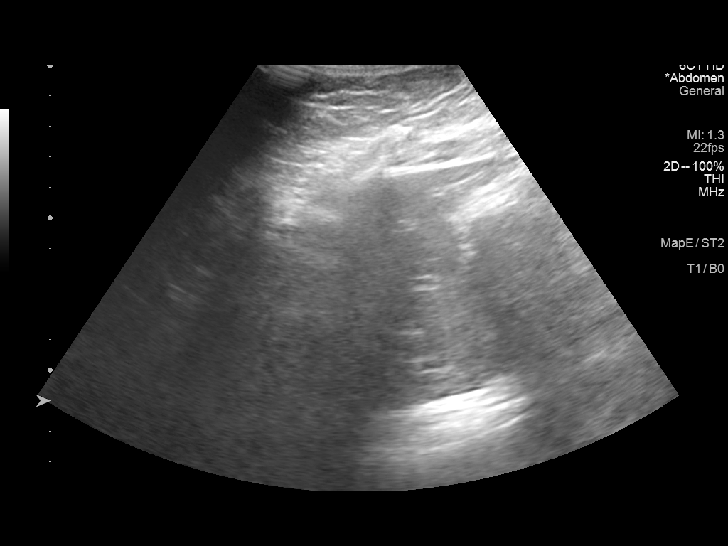
[im 17/37]
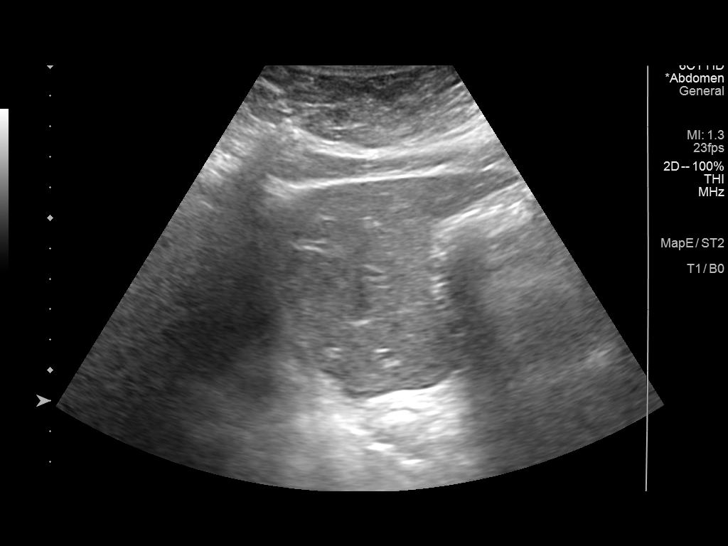
[im 20/37]
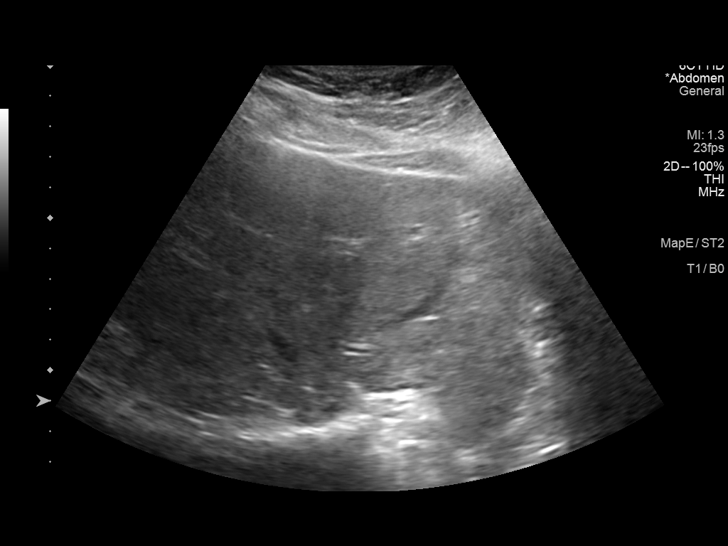
[im 23/37]
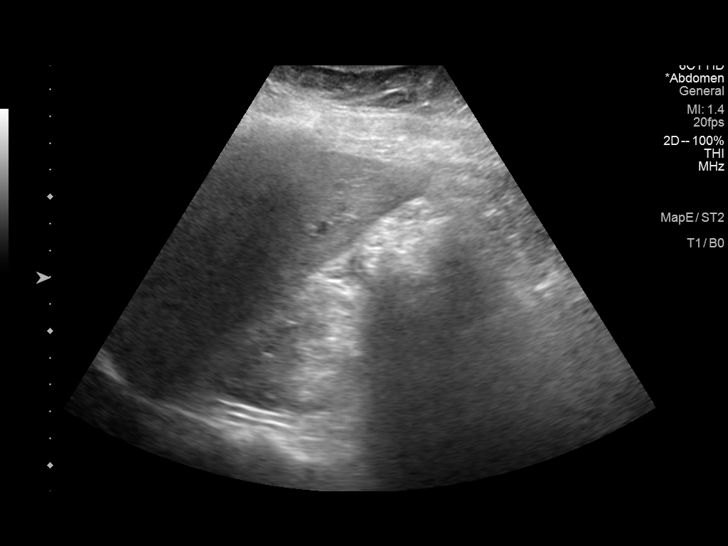
[im 25/37]
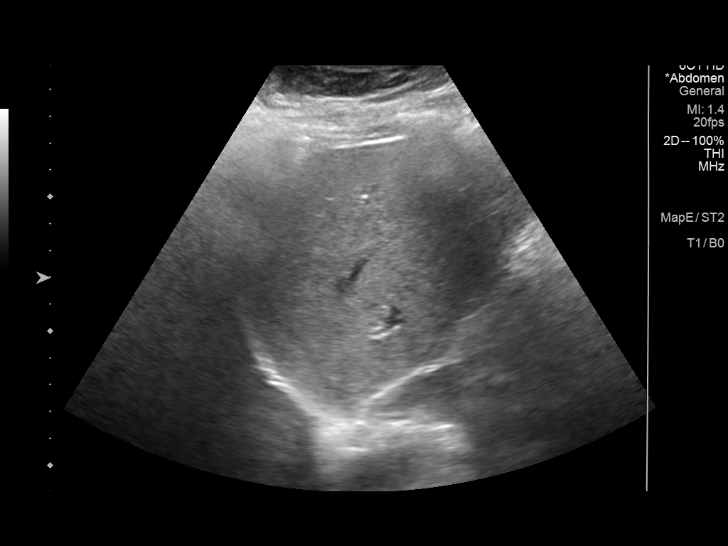
[im 28/37]
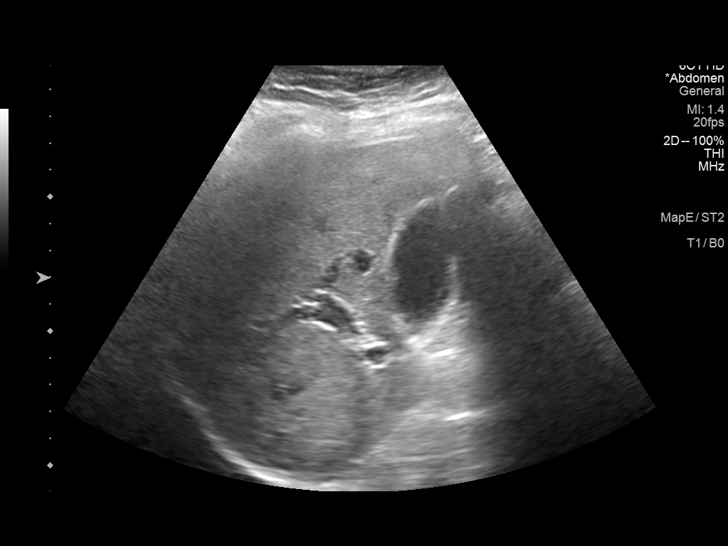
[im 31/37]
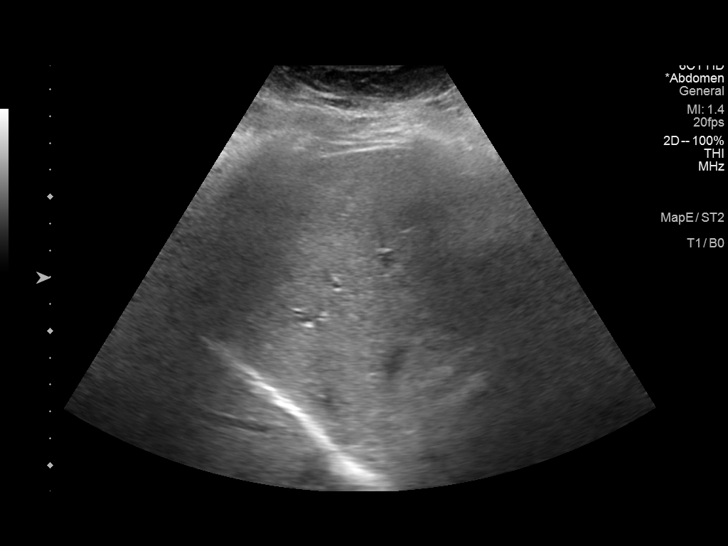
[im 34/37]
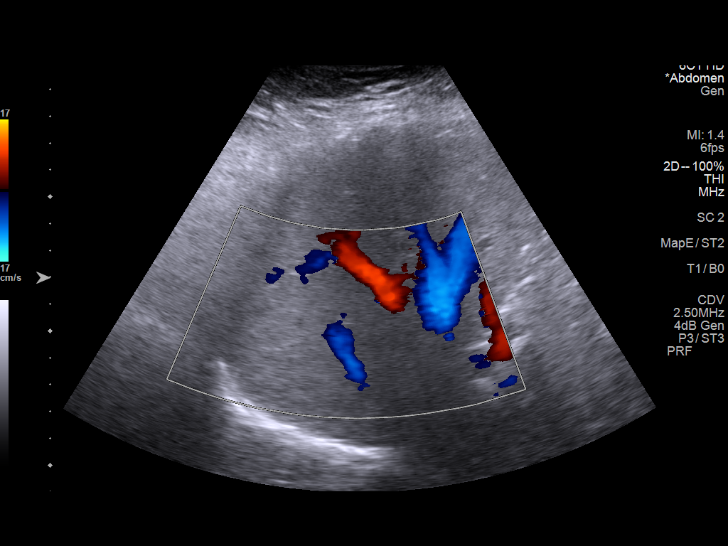
[im 37/37]
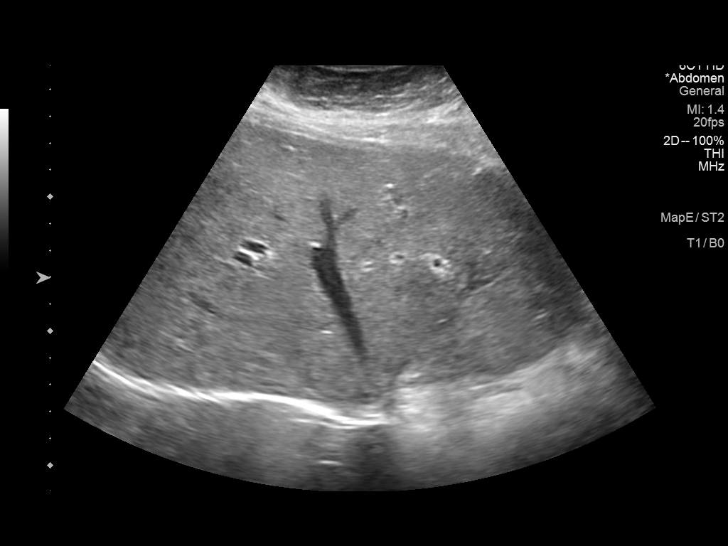

[14 of 25 positions shown; findings below may reference images not displayed]

FINDINGS: Gallbladder:

No gallstones or wall thickening visualized. No sonographic [place]
sign noted by sonographer.

Common bile duct:

Diameter: 2 mm

Liver:

No focal lesion identified. Within normal limits in parenchymal
echogenicity. Portal vein is patent on color Doppler imaging with
normal direction of blood flow towards the liver.

Other: None.
IMPRESSION: No significant sonographic abnormality of the kidneys or liver.

## 2023-02-15 ENCOUNTER — Other Ambulatory Visit: Payer: Self-pay | Admitting: Family Medicine

## 2023-02-24 ENCOUNTER — Other Ambulatory Visit: Payer: Self-pay

## 2023-02-24 DIAGNOSIS — E1165 Type 2 diabetes mellitus with hyperglycemia: Secondary | ICD-10-CM

## 2023-02-24 DIAGNOSIS — E1169 Type 2 diabetes mellitus with other specified complication: Secondary | ICD-10-CM

## 2023-02-24 MED ORDER — EMPAGLIFLOZIN 25 MG PO TABS
25.0000 mg | ORAL_TABLET | Freq: Every day | ORAL | 3 refills | Status: DC
Start: 2023-02-24 — End: 2024-02-01

## 2023-02-24 MED ORDER — INSULIN GLARGINE 100 UNIT/ML SOLOSTAR PEN
8.0000 [IU] | PEN_INJECTOR | SUBCUTANEOUS | 11 refills | Status: DC
Start: 2023-02-24 — End: 2023-05-12

## 2023-03-13 ENCOUNTER — Encounter: Payer: Self-pay | Admitting: Student

## 2023-03-13 ENCOUNTER — Ambulatory Visit: Payer: BC Managed Care – PPO | Admitting: Student

## 2023-03-13 VITALS — BP 101/76 | HR 66 | Ht 61.5 in | Wt 172.6 lb

## 2023-03-13 DIAGNOSIS — Z23 Encounter for immunization: Secondary | ICD-10-CM | POA: Diagnosis not present

## 2023-03-13 DIAGNOSIS — Z7985 Long-term (current) use of injectable non-insulin antidiabetic drugs: Secondary | ICD-10-CM | POA: Diagnosis not present

## 2023-03-13 DIAGNOSIS — E119 Type 2 diabetes mellitus without complications: Secondary | ICD-10-CM | POA: Diagnosis not present

## 2023-03-13 DIAGNOSIS — N6323 Unspecified lump in the left breast, lower outer quadrant: Secondary | ICD-10-CM | POA: Diagnosis not present

## 2023-03-13 LAB — POCT GLYCOSYLATED HEMOGLOBIN (HGB A1C): HbA1c, POC (controlled diabetic range): 7.5 % — AB (ref 0.0–7.0)

## 2023-03-13 NOTE — Patient Instructions (Signed)
It was great to see you! Thank you for allowing me to participate in your care!   I recommend that you always bring your medications to each appointment as this makes it easy to ensure we are on the correct medications and helps [place] not miss when refills are needed.  Our plans for today:  - Try to reduce night time snacking to improve your A1C - Please go to your mammogram appointment, the date and time is provided on a separate paper - Please go to your visit with Dr. Ardyth Harps below   Take care and seek immediate care sooner if you develop any concerns. Please remember to show up 15 minutes before your scheduled appointment time!  Tiffany Kocher, DO [facility]

## 2023-03-13 NOTE — Assessment & Plan Note (Signed)
A1c 7.5, lower than prior but still slightly above goal.  Patient not agreeable to medication adjustment at this time. - Continue Lantus 8 units nightly, Humalog 4 units in the morning (although this is prescribed as twice daily) - Continue metformin 100 mg twice daily, continue Jardiance 25 mg daily - Confirm Ozempic use at follow-up

## 2023-03-13 NOTE — Assessment & Plan Note (Signed)
Nonpainful breast mass.  On breast exam, noted to have multiple small lumps in right upper quadrant, right lower quadrant and left lower quadrant of left breast. Suspect fibrocystic changes. - Obtain diagnostic mammogram with ultrasound

## 2023-03-13 NOTE — Progress Notes (Signed)
    SUBJECTIVE:   CHIEF COMPLAINT / HPI:   Diabetes mellitus Patient due for hemoglobin A1c.  Currently taking 8 units of Lantus at night and 4 units in the morning.  Also taking 1000 mg metformin twice daily and Jardiance.  Discussed A1c still above goal, patient does not want medication.  Breast mass Patient states she feels a lump in her left breast.  Lump is located underneath the breast in the lower outer quadrant.  It is not painful.  She has no history of cancer, no family history of breast cancer.  She does not smoke.  OBJECTIVE:   BP 101/76   Pulse 66   Ht 5' 1.5" (1.562 m)   Wt 172 lb 9.6 oz (78.3 kg)   SpO2 100%   BMI 32.08 kg/m    General: NAD, pleasant, Cardio: RRR, no MRG.  Respiratory: CTAB, normal wob on RA Skin: Warm and dry  Chaperone Alexis CMA present during breast exam Breast exam:  Multiple firm, lumps in right upper quadrant, right lower quadrant and left lower quadrant of left breast.  ASSESSMENT/PLAN:   Assessment & Plan Mass of lower outer quadrant of left breast Nonpainful breast mass.  On breast exam, noted to have multiple small lumps in right upper quadrant, right lower quadrant and left lower quadrant of left breast. Suspect fibrocystic changes. - Obtain diagnostic mammogram with ultrasound Diabetes mellitus without complication (HCC) A1c 7.5, lower than prior but still slightly above goal.  Patient not agreeable to medication adjustment at this time. - Continue Lantus 8 units nightly, Humalog 4 units in the morning (although this is prescribed as twice daily) - Continue metformin 100 mg twice daily, continue Jardiance 25 mg daily - Confirm Ozempic use at follow-up   Follow-up recommendations Confirm Ozempic use Discuss health maintenance care gaps  Tiffany Kocher, DO [facility]

## 2023-03-22 ENCOUNTER — Ambulatory Visit: Payer: Self-pay | Admitting: Family Medicine

## 2023-03-23 ENCOUNTER — Ambulatory Visit
Admission: RE | Admit: 2023-03-23 | Discharge: 2023-03-23 | Disposition: A | Discharge status: Home / Self Care | Payer: BC Managed Care – PPO | Source: Ambulatory Visit | Attending: Family Medicine | Admitting: Family Medicine

## 2023-03-23 DIAGNOSIS — N6323 Unspecified lump in the left breast, lower outer quadrant: Secondary | ICD-10-CM

## 2023-03-24 ENCOUNTER — Encounter: Payer: Self-pay | Admitting: Family Medicine

## 2023-03-24 ENCOUNTER — Ambulatory Visit (INDEPENDENT_AMBULATORY_CARE_PROVIDER_SITE_OTHER): Payer: BC Managed Care – PPO | Admitting: Family Medicine

## 2023-03-24 VITALS — BP 102/64 | HR 83 | Wt 175.0 lb

## 2023-03-24 DIAGNOSIS — Z30017 Encounter for initial prescription of implantable subdermal contraceptive: Secondary | ICD-10-CM

## 2023-03-24 DIAGNOSIS — Z3046 Encounter for surveillance of implantable subdermal contraceptive: Secondary | ICD-10-CM

## 2023-03-24 LAB — POCT URINE PREGNANCY: Preg Test, Ur: NEGATIVE

## 2023-03-24 NOTE — Patient Instructions (Addendum)
Th?t tuy?t v?i khi ???c g?p b?n ngy hm nay! C?m ?n b?n ? l?a ch?n [facility].   Vui lng mang theo T?T C? cc lo?i thu?c c?a b?n trong m?i l?n khm.   Hm nay chng ta ? ni v?:  1. Hm nay chng ti ? l?y ra v l?p m?t chi?c Neplanon m?i vo. Ti mu?n theo di ?? b?o tr ch?m [place] s?c kh?e trong thng t?i  N?u b?n ch?a ??ng k, hy ??ng k Bi?u ?? c?a ti ?? d? dng truy c?p vo k?t qu? xt nghi?m c?a b?n v lin l?c v?i bc s? ch?m [place] chnh c?a b?n.   Hy g?i cho phng khm theo s? [phone] n?u cc tri?u ch?ng c?a b?n tr? nn tr?m tr?ng h?n ho?c b?n c b?t k? lo ng?i no.  Hy nh? ??t l?ch theo di t?i qu?y l? tn tr??c khi b?n r?i ?i hm nay.   Elberta Fortis, LM Y h?c gia ?nh   Nexplanon, Ch?m [place] sau Hy tham kh?o t? ny trong vi tu?n t?i. Nh?ng h??ng d?n ny cung c?p cho b?n thng tin v? vi?c ch?m [place] b?n thn sau th? thu?t. Nh cung c?p d?ch v? ch?m [place] s?c kho? c?a b?n c?ng c th? cung c?p cho b?n nh?ng h??ng d?n c? th? h?n. Vi?c ?i?u tr? c?a b?n ? ???c ln k? ho?ch theo cc ph??ng php ?i?u tr? y t? hi?n hnh, nh?ng ?i khi v?n c v?n ?? x?y ra. Hy g?i cho nh cung c?p d?ch v? ch?m [place] s?c kh?e c?a b?n n?u b?n c b?t k? v?n ?? ho?c th?c m?c no sau th? thu?t. Ti c th? mong ??i ?i?u g sau th? t?c? Sau th? t?c, thng th??ng c: ? ?au nh?c. ? S? ?m p. ? S?ng t?y. B?n c th? b? ?au, s?ng v nng nhi?u h?n so v?i tr??c khi tim. Ph?n ?ng ny c th? ko di kho?ng m?t ngy. Lm theo h??ng d?n sau t?i nh: t?m ? N?u b?n ???c c?p m?t mi?ng b?ng (b?ng), hy gi? n kh cho ??n khi nh cung c?p d?ch v? ch?m [place] s?c kh?e c?a b?n cho bi?t c th? tho n ra. H?i nh cung c?p d?ch v? ch?m [place] s?c kh?e c?a b?n khi no b?n c th? b?t ??u t?m vi sen ho?c t?m b?n. Ki?m sot c?n ?au, c?ng kh?p v s?ng t?y ? N?u ???c h??ng d?n, hy ch??m ? ln vng ?:  ? Cho ? vo ti nh?a.  ? ??t m?t chi?c kh?n gi?a da c?a b?n v ti.  ? ?? ? trong 20 pht, 2-3 l?n m?i  ngy. ? Khng ch??m nng ln ??u g?i. ? Nng vng tim ln cao h?n tim khi b?n ?ang ng?i ho?c n?m. Ho?t ??ng ? [place] cc ho?t ??ng g?ng s?c trong th?i gian theo ch? d?n c?a nh cung c?p d?ch v? ch?m [place] s?c kh?e c?a b?n. Hy h?i nh cung c?p d?ch v? ch?m [place] s?c kh?e c?a b?n khi no b?n c th? tr? l?i ho?t ??ng bnh th??ng. H??ng d?n chung ? Ch? dng thu?c theo ch? d?n c?a nh cung c?p d?ch v? ch?m [place] s?c kh?e c?a b?n. ? Khng dng aspirin ho?c cc lo?i thu?c khng k ??n khc tr? khi nh cung c?p d?ch v? ch?m [place] s?c kho? c?a b?n ni r?ng b?n c th?. ? Ki?m tra trang web Nexplanon c?a b?n m?i ngy ?? tm d?u hi?u nhi?m trng. Theo di:  ? ??, s?ng ho?c ?au.  ?  Ch?t l?ng, mu ho?c m?. ? Lm theo h??ng d?n c?a nh cung c?p d?ch v? ch?m [place] s?c kh?e c?a b?n v? vi?c thay v c?i b?ng. Lin h? v?i nh cung c?p d?ch v? ch?m [place] s?c kh?e n?u: ? B?n c cc tri?u ch?ng t?i c? s? Nexplanon ko di h?n hai ngy sau khi lm th? thu?t. ? B?n b? ??, s?ng ho?c ?au ? vng tim. ? B?n c ch?t l?ng, mu ho?c m? ch?y ra t? ch? tim. ? B?n c?m th?y ?m p trong khu v?c Nexplanon c?a mnh. ? B?n b? s?t. ? C?n ?au c?a b?n khng ???c ki?m sot b?ng thu?c.   It was wonderful to see you today! Thank you for choosing [facility].   Please bring ALL of your medications with you to every visit.   Today we talked about:  We took out and inserted a new Neplanon today. I would like to follow up for healthcare maintenance in the next month  If you haven't already, sign up for My Chart to have easy access to your labs results, and communication with your primary care physician.   Call the clinic at [phone] if your symptoms worsen or you have any concerns.  Please be sure to schedule follow up at the front desk before you leave today.   Elberta Fortis, DO Family Medicine    Nexplanon, Care After Refer to this sheet in the next few weeks. These instructions provide you with information  about caring for yourself after your procedure. Your health care provider may also give you more specific instructions. Your treatment has been planned according to current medical practices, but problems sometimes occur. Call your health care provider if you have any problems or questions after your procedure. What can I expect after the procedure? After the procedure, it is common to have: Soreness. Warmth. Swelling. You may have more pain, swelling, and warmth than you did before the injection. This reaction may last for about one day. Follow these instructions at home: Bathing If you were given a bandage (dressing), keep it dry until your health care provider says it can be removed. Ask your health care provider when you can start showering or taking a bath. Managing pain, stiffness, and swelling If directed, apply ice to the area: Put ice in a plastic bag. Place a towel between your skin and the bag. Leave the ice on for 20 minutes, 2-3 times per day. Do not apply heat to your knee. Raise the injection area above the level of your heart while you are sitting or lying down. Activity Avoid strenuous activities for as long as directed by your health care provider. Ask your health care provider when you can return to your normal activities. General instructions Take medicines only as directed by your health care provider. Do not take aspirin or other over-the-counter medicines unless your health care provider says you can. Check your Nexplanon site every day for signs of infection. Watch for: Redness, swelling, or pain. Fluid, blood, or pus. Follow your health care provider's instructions about dressing changes and removal. Contact a health care provider if: You have symptoms at your Nexplanon site that last longer than two days after your procedure. You have redness, swelling, or pain in your injection area. You have fluid, blood, or pus coming from your injection site. You have  warmth in your Nexplanon area. You have a fever. Your pain is not controlled with medicine.

## 2023-03-24 NOTE — Progress Notes (Signed)
    SUBJECTIVE:   CHIEF COMPLAINT / HPI:   *Accompanied by [place] interpreter  Nexplanon removal and insertion Continues to have regular monthly cycles even with Nexplanon.  Denies perimenopausal symptoms.  Would like Nexplanon replaced given possibility she is still ovulating.  PERTINENT  PMH / PSH: T2DM, HLD, HTN  OBJECTIVE:   BP 102/64   Pulse 83   Wt 175 lb (79.4 kg)   LMP 03/06/2023 (Approximate)   SpO2 98%   BMI 32.53 kg/m    General: Alert, no apparent distress, well groomed HEENT: Normocephalic, atraumatic, moist mucus membranes, neck supple Respiratory: Normal respiratory effort GI: Non-distended Skin: No rashes, no jaundice Psych: Appropriate mood and affect  ASSESSMENT/PLAN:   GYNECOLOGY PROCEDURE NOTE  Nexplanon Removal and Insertion  Patient identified, informed consent performed, consent signed.   Patient does understand that irregular bleeding is a very common side effect of this medication. She was advised to have backup contraception for one week after replacement of the implant. Pregnancy test in clinic today was negative.  Appropriate time out taken. Implanon site identified. Area prepped in usual sterile fashon. One ml of 1% lidocaine was used to anesthetize the area at the distal end of the implant. A small stab incision was made right beside the implant on the distal portion. The Nexplanon rod was grasped using hemostats and removed without difficulty. There was minimal blood loss. There were no complications. Area was then injected with 3 ml of 1 % lidocaine. She was re-prepped with betadine, Nexplanon removed from packaging, Device confirmed in needle, then inserted full length of needle and withdrawn per handbook instructions. Nexplanon was able to palpated in the patient's arm; patient palpated the insert herself.  There was minimal blood loss. Patient insertion site covered with guaze and a pressure bandage to reduce any bruising. The patient  tolerated the procedure well and was given post procedure instructions.  She was advised to have backup contraception for one week.    Dr. Elberta Fortis, DO [facility]

## 2023-03-31 MED ORDER — ETONOGESTREL 68 MG ~~LOC~~ IMPL
68.0000 mg | DRUG_IMPLANT | Freq: Once | SUBCUTANEOUS | Status: AC
  Administered 2023-03-24: 68 mg via SUBCUTANEOUS

## 2023-03-31 NOTE — Addendum Note (Signed)
Addended by: Veronda Prude on: 03/31/2023 02:13 PM   Modules accepted: Orders

## 2023-04-04 NOTE — Progress Notes (Unsigned)
    SUBJECTIVE:   CHIEF COMPLAINT / HPI:   Diabetes Current Regimen: Lantus 8 units nightly, Humalog 4 units in the morning, Metformin 1000 mg twice daily, Jardiance 25 mg daily  CBGs: ***  Last A1c:  Lab Results  Component Value Date   HGBA1C 7.5 (A) 03/13/2023    Denies polyuria, polydipsia, hypoglycemia *** Last Eye Exam: *** Statin: *** ACE/ARB: ***   *Needs eye exam, foot exam  *Colonoscopy?  PERTINENT  PMH / PSH: ***  OBJECTIVE:   LMP 03/06/2023 (Approximate)  ***  General: NAD, pleasant, able to participate in exam Cardiac: RRR, no murmurs. Respiratory: CTAB, normal effort, No wheezes, rales or rhonchi Abdomen: Bowel sounds present, nontender, nondistended Extremities: no edema or cyanosis. Skin: warm and dry, no rashes noted Neuro: alert, no obvious focal deficits Psych: Normal affect and mood  ASSESSMENT/PLAN:   No problem-specific Assessment & Plan notes found for this encounter.     Dr. Elberta Fortis, DO [facility]    {    This will disappear when note is signed, click to select method of visit    :1}

## 2023-04-05 ENCOUNTER — Ambulatory Visit: Payer: BC Managed Care – PPO | Admitting: Family Medicine

## 2023-04-05 VITALS — BP 120/82 | HR 74 | Ht 61.5 in | Wt 172.0 lb

## 2023-04-05 DIAGNOSIS — Z7985 Long-term (current) use of injectable non-insulin antidiabetic drugs: Secondary | ICD-10-CM | POA: Diagnosis not present

## 2023-04-05 DIAGNOSIS — Z114 Encounter for screening for human immunodeficiency virus [HIV]: Secondary | ICD-10-CM | POA: Diagnosis not present

## 2023-04-05 DIAGNOSIS — Z1211 Encounter for screening for malignant neoplasm of colon: Secondary | ICD-10-CM

## 2023-04-05 DIAGNOSIS — E119 Type 2 diabetes mellitus without complications: Secondary | ICD-10-CM | POA: Diagnosis not present

## 2023-04-05 NOTE — Assessment & Plan Note (Signed)
CGM reviewed, continues to have large postprandial spike after lunch.  Likely in the setting of rice intake this is her largest meal.  Continue current therapy with Ozempic, Jardiance, Metformin and Lantus. -Increase to Humalog 4-6 units -Referral placed ophthalmology for diabetic eye exam -BMP and ACR today

## 2023-04-05 NOTE — Patient Instructions (Addendum)
Th?t tuy?t v?i khi ???c g?p b?n ngy hm nay! C?m ?n b?n ? l?a ch?n [facility].   Vui lng mang theo T?T C? cc lo?i thu?c c?a b?n trong m?i l?n khm.   Hm nay chng ta ? ni v?:  1. Vui lng t?ng ln Humalog 6 ??n v? vo bu?i chi?u. Ti mu?n b?n ti?p t?c u?ng 4 ??n v? vo bu?i sng.  Hm nay chng ti c?ng ?ang ki?m tra m?t s? nghin c?u v? cng th?c mu v n??c ti?u ?? tm b?nh ti?u ???ng c?a b?n, ti s? cho b?n bi?t nh?ng k?t qu? ?. 2. Ti ? gi?i thi?u b?n ??n bc s? GI ?? th?c hi?n n?i soi. V?n phng chng ti s? g?i cho b?n v?i thng tin ?Marland Kitchen  N?u n?i soi b? to bn.  Vui lng lin h? v?i v?n phng c?a chng ti v chng ti c th? th?o lu?n v? cc l?a ch?n thay th?. 3. Ti c?ng ? gi?i thi?u b?n ??n bc s? nhn khoa ?? ki?m tra nh?ng thay ??i ? m?t do b?nh ti?u ???ng, v?n phng chng ti s? g?i cho b?n ?? thng bo thng tin ny.  Vui lng theo di sau 3 thng  N?u b?n ch?a ??ng k, hy ??ng k Bi?u ?? c?a ti ?? d? dng truy c?p vo k?t qu? xt nghi?m c?a b?n v lin l?c v?i bc s? ch?m [place] chnh c?a b?n.   Chng ti ?ang ki?m tra m?t s? phng th nghi?m ngy hm nay. N?u chng b?t th??ng, ti s? g?i cho b?n. N?u chng bnh th??ng, ti s? g?i cho b?n m?t tin nh?n MyChart (n?u n ?ang ho?t ??ng) ho?c m?t l th? qua ???ng b?u ?i?n. N?u b?n khng nh?n ???c thng tin g v? phng th nghi?m c?a mnh trong 2 tu?n t?i, vui lng g?i cho v?n phng.  Hy g?i cho phng khm theo s? [phone] n?u cc tri?u ch?ng c?a b?n tr? nn tr?m tr?ng h?n ho?c b?n c b?t k? lo ng?i no.  Hy nh? ??t l?ch theo di t?i qu?y l? tn tr??c khi b?n r?i ?i hm nay.   Morgan Osborne, LM Y h?c gia ?nh  It was wonderful to see you today! Thank you for choosing [facility].   Please bring ALL of your medications with you to every visit.   Today we talked about:  Please increase to Humalog 6 units in afternoon. I would like you to continue to take 4 units in the morning.   We are also checking some blood work and urine studies today for your diabetes, I will let you know those results. I referred you to the GI doctor to have a colonscopy done. Our office will call you with that information.  If the colonoscopy is constipated.  Please follow-up with our office as we can discuss alternative options. I also referred you to an eye doctor to check for diabetic eye changes, our office will call you with this information.  Please follow up in 3 months  If you haven't already, sign up for My Chart to have easy access to your labs results, and communication with your primary care physician.   We are checking some labs today. If they are abnormal, I will call you. If they are normal, I will send you a MyChart message (if it is active) or a letter in the mail. If you do not hear about your labs in the next 2 weeks, please call  the office.  Call the clinic at [phone] if your symptoms worsen or you have any concerns.  Please be sure to schedule follow up at the front desk before you leave today.   Morgan Fortis, DO Family Medicine

## 2023-04-06 LAB — BASIC METABOLIC PANEL
BUN/Creatinine Ratio: 15 (ref 9–23)
BUN: 11 mg/dL (ref 6–24)
CO2: 21 mmol/L (ref 20–29)
Calcium: 9.6 mg/dL (ref 8.7–10.2)
Chloride: 102 mmol/L (ref 96–106)
Creatinine, Ser: 0.73 mg/dL (ref 0.57–1.00)
Glucose: 114 mg/dL — ABNORMAL HIGH (ref 70–99)
Potassium: 4.6 mmol/L (ref 3.5–5.2)
Sodium: 138 mmol/L (ref 134–144)
eGFR: 101 mL/min/{1.73_m2} (ref >59)

## 2023-04-06 LAB — HIV ANTIBODY (ROUTINE TESTING W REFLEX): HIV Screen 4th Generation wRfx: NONREACTIVE

## 2023-04-07 LAB — MICROALBUMIN / CREATININE URINE RATIO
Creatinine, Urine: 10.7 mg/dL
Microalb/Creat Ratio: 55 mg/g{creat} — ABNORMAL HIGH (ref 0–29)
Microalbumin, Urine: 5.9 ug/mL

## 2023-04-18 ENCOUNTER — Other Ambulatory Visit: Payer: Self-pay

## 2023-04-18 DIAGNOSIS — E785 Hyperlipidemia, unspecified: Secondary | ICD-10-CM

## 2023-04-18 DIAGNOSIS — E1169 Type 2 diabetes mellitus with other specified complication: Secondary | ICD-10-CM

## 2023-04-18 MED ORDER — ROSUVASTATIN CALCIUM 10 MG PO TABS: 10.0000 mg | ORAL_TABLET | Freq: Every day | ORAL | 1 refills | Status: DC

## 2023-05-12 ENCOUNTER — Other Ambulatory Visit: Payer: Self-pay

## 2023-05-12 DIAGNOSIS — E1169 Type 2 diabetes mellitus with other specified complication: Secondary | ICD-10-CM

## 2023-05-12 MED ORDER — INSULIN GLARGINE 100 UNIT/ML SOLOSTAR PEN: 8.0000 [IU] | PEN_INJECTOR | SUBCUTANEOUS | 11 refills | Status: DC

## 2023-05-19 ENCOUNTER — Other Ambulatory Visit (HOSPITAL_COMMUNITY): Payer: Self-pay

## 2023-05-22 ENCOUNTER — Other Ambulatory Visit (HOSPITAL_COMMUNITY): Payer: Self-pay

## 2023-05-22 ENCOUNTER — Telehealth: Payer: Self-pay

## 2023-05-22 DIAGNOSIS — E119 Type 2 diabetes mellitus without complications: Secondary | ICD-10-CM

## 2023-05-22 NOTE — Telephone Encounter (Signed)
 Pharmacy Patient Advocate Encounter   Received notification from CoverMyMeds that prior authorization for Freestyle Libre 3 Plus Sensors is required/requested.    The patient is insured through [facility] .   PA required; PA submitted to above mentioned insurance via CoverMyMeds Key/confirmation #/[id number]. Status is pending

## 2023-05-23 MED ORDER — DEXCOM G7 SENSOR MISC: 6 refills | Status: DC

## 2023-05-23 NOTE — Telephone Encounter (Signed)
 Pharmacy Patient Advocate Encounter  Received notification from [employer] that Prior Authorization for FREESTYLE LIBRE 3 PLUS SENSORS has been DENIED.  Full denial letter will be uploaded to the media tab. See denial reason below.  Needs documentation of failure of Dexcom brand CGM.   Dexcom preferred  PA #/Case ID/Reference #:  [id number]

## 2023-05-23 NOTE — Addendum Note (Signed)
 Addended by: Elberta Fortis on: 05/23/2023 02:05 PM   Modules accepted: Orders

## 2023-06-21 ENCOUNTER — Other Ambulatory Visit: Payer: Self-pay | Admitting: *Deleted

## 2023-06-21 DIAGNOSIS — I10 Essential (primary) hypertension: Secondary | ICD-10-CM

## 2023-06-21 MED ORDER — LOSARTAN POTASSIUM-HCTZ 50-12.5 MG PO TABS: 1.0000 | ORAL_TABLET | Freq: Every day | ORAL | 3 refills | Status: DC

## 2023-06-23 ENCOUNTER — Other Ambulatory Visit: Payer: Self-pay | Admitting: Family Medicine

## 2023-07-09 IMAGING — DX DG FOOT 2V*R*
2 series · 2 of 2 positions shown · non-contrast
Comparison: None.

CLINICAL DATA: Right foot pain and swelling.

EXAM:
RIGHT FOOT - 2 VIEW

[foot ap]
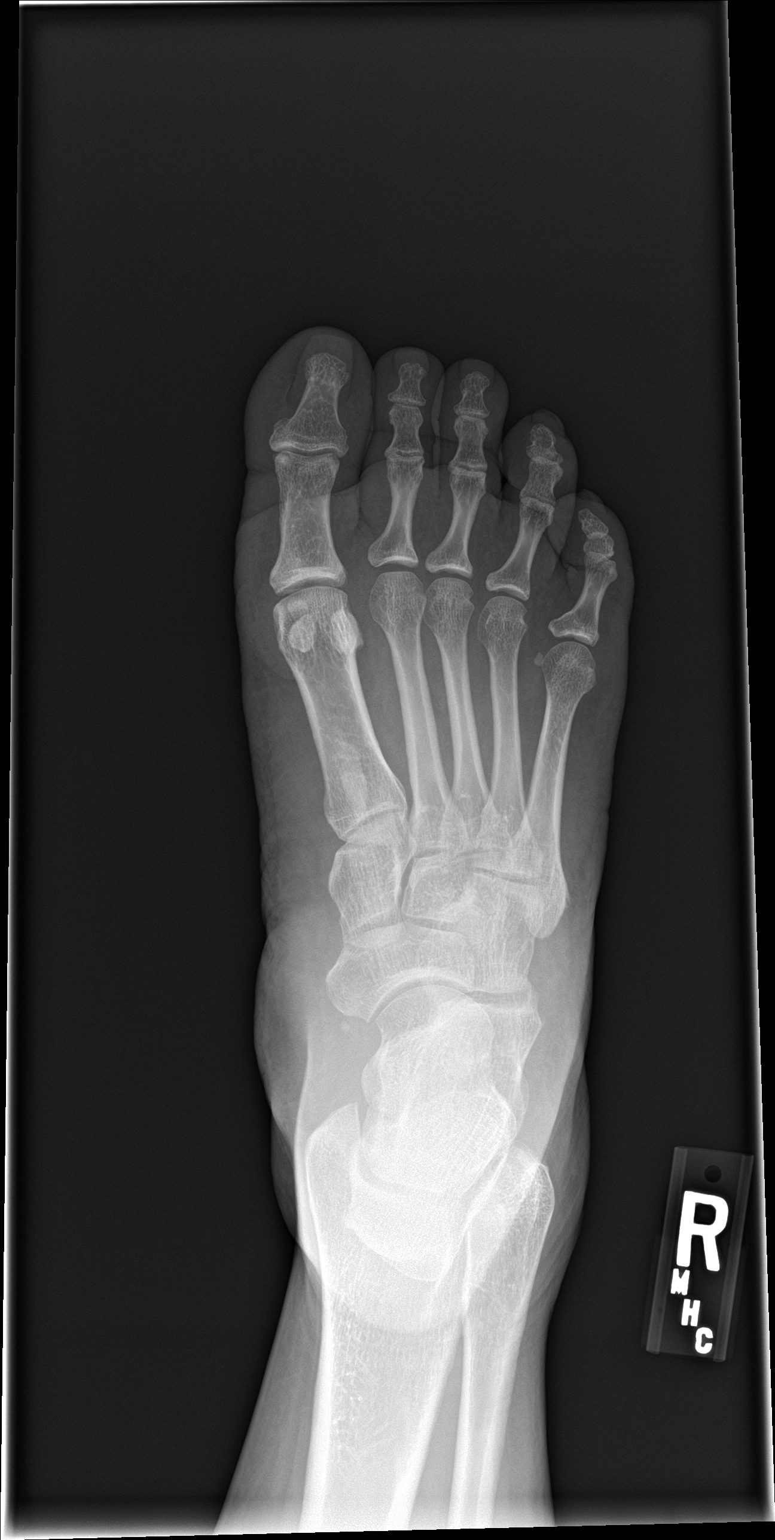

[foot lat]
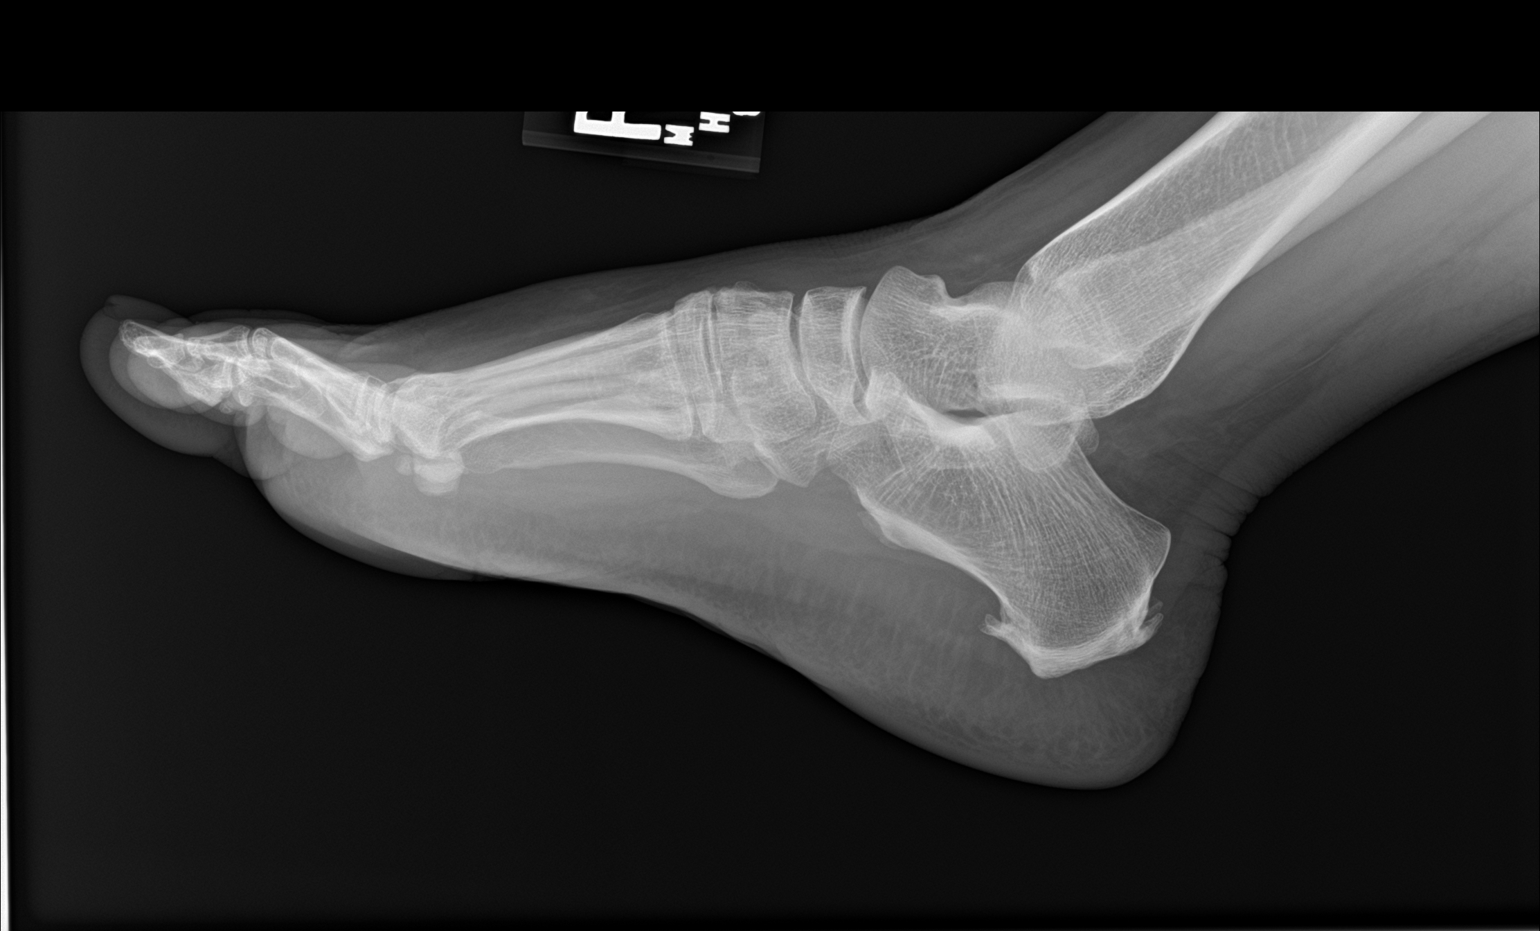

[2 of 2 positions shown; findings below may reference images not displayed]

FINDINGS: No acute fracture or dislocation. Joint spaces are preserved. Bone
mineralization is normal. Plantar and Achilles enthesophytes. Mild
diffuse soft tissue swelling.
IMPRESSION: 1. Mild diffuse soft tissue swelling. No acute osseous abnormality.

## 2023-09-01 NOTE — Progress Notes (Signed)
    SUBJECTIVE:   CHIEF COMPLAINT / HPI:   T2DM Eats at 11AM for lunch and 5PM for dinner. Current Regimen:  Lantus  8 units nightly, Humalog  4 units AM then 6 units PM, Metformin  1000 mg twice daily, Jardiance  25 mg daily, Ozempic  2mg  weekly  CBGs: CGM reviewed. 4% in 54-69, 65% time in range.11 hypoglycemic events. GMI 7.0%. 95% sensor usage time.  Last A1c:  Lab Results  Component Value Date   HGBA1C 8.1 (A) 09/04/2023    Last Eye Exam: Referred at last visit Statin: Rosuvastatin  10mg  daily ACE/ARB: Losartan  50mg    Shoulder pain Bilateral pain. Cannot lift heavy weights. Right one is worse. Right side about 1 month, left side more like 4-5 months. Denies inciting injury. Massage the area but not taking anything and would prefer to avoid oral pills. Works as Advertising copywriter.   Having a very difficult time, states her daughter has only 6 months to live due to heart and lung issues.   PERTINENT  PMH / PSH: T2DM, HTN   OBJECTIVE:   BP 111/73   Pulse 85   Ht 5' 1.5" (1.562 m)   Wt 170 lb 9.6 oz (77.4 kg)   SpO2 99%   BMI 31.71 kg/m    General: Alert, no apparent distress, well groomed HEENT: Normocephalic, atraumatic, moist mucus membranes, neck supple Respiratory: Normal respiratory effort GI: Non-distended MSK: TTP over anterior shoulders, biceps and elbows bilaterally.  Full ROM with some discomfort since worse in abduction overhead.  Negative empty can test.  Negative Speed's Test. Skin: No rashes, no jaundice Psych: Appropriate mood and affect ASSESSMENT/PLAN:   Assessment & Plan Type 2 diabetes mellitus with diabetic microalbuminuria, with long-term current use of insulin  (HCC) A1c 8.1, up from 7.5 in 03/2023.  Unfortunately patient under significant stress caring for her ill daughter.  CGM reviewed, notable for 11 hypoglycemic events in the last month particularly in the morning.  Large postprandial spike around 1 PM to ~300 and patient remains high the rest of the day.   Likely needs more targeted insulin  management with meals and reduction of LAI given hypoglycemic events. -Increase to Humalog  6 units twice daily with meals -Consider adding on sliding scale insulin  at lunchtime, would benefit from education with Dr. Koval -Decrease Lantus  5 units daily -Follow-up in 3 months for A1c Chronic pain of both shoulders Most consistent with overuse given work as Advertising copywriter. No instability noted.  Patient prefers to avoid oral medications, will trial supportive care and work accommodations to facilitate rest. -Advised ice, Voltaren gel 4 times daily as needed and Tylenol to 1000 mg 4 times daily -Work accommodation note provided and encouraged rest   Dr. Jonne Netters, DO [facility]

## 2023-09-04 ENCOUNTER — Encounter: Payer: Self-pay | Admitting: Family Medicine

## 2023-09-04 ENCOUNTER — Ambulatory Visit: Admitting: Family Medicine

## 2023-09-04 VITALS — BP 111/73 | HR 85 | Ht 61.5 in | Wt 170.6 lb

## 2023-09-04 DIAGNOSIS — E1169 Type 2 diabetes mellitus with other specified complication: Secondary | ICD-10-CM

## 2023-09-04 DIAGNOSIS — E1129 Type 2 diabetes mellitus with other diabetic kidney complication: Secondary | ICD-10-CM

## 2023-09-04 DIAGNOSIS — M25512 Pain in left shoulder: Secondary | ICD-10-CM

## 2023-09-04 DIAGNOSIS — E785 Hyperlipidemia, unspecified: Secondary | ICD-10-CM

## 2023-09-04 DIAGNOSIS — Z794 Long term (current) use of insulin: Secondary | ICD-10-CM | POA: Diagnosis not present

## 2023-09-04 DIAGNOSIS — M25511 Pain in right shoulder: Secondary | ICD-10-CM | POA: Diagnosis not present

## 2023-09-04 DIAGNOSIS — G8929 Other chronic pain: Secondary | ICD-10-CM

## 2023-09-04 DIAGNOSIS — R809 Proteinuria, unspecified: Secondary | ICD-10-CM

## 2023-09-04 DIAGNOSIS — Z7985 Long-term (current) use of injectable non-insulin antidiabetic drugs: Secondary | ICD-10-CM

## 2023-09-04 LAB — POCT GLYCOSYLATED HEMOGLOBIN (HGB A1C): HbA1c, POC (controlled diabetic range): 8.1 % — AB (ref 0.0–7.0)

## 2023-09-04 MED ORDER — INSULIN LISPRO (1 UNIT DIAL) 100 UNIT/ML (KWIKPEN): 6.0000 [IU] | PEN_INJECTOR | Freq: Two times a day (BID) | SUBCUTANEOUS | 11 refills | Status: DC

## 2023-09-04 MED ORDER — INSULIN GLARGINE 100 UNIT/ML SOLOSTAR PEN: 5.0000 [IU] | PEN_INJECTOR | SUBCUTANEOUS | 11 refills | Status: DC

## 2023-09-04 MED ORDER — DICLOFENAC SODIUM 1 % EX GEL: 2.0000 g | Freq: Four times a day (QID) | CUTANEOUS | 3 refills | Status: AC

## 2023-09-04 NOTE — Patient Instructions (Addendum)
 Th?t tuy?t khi ???c g?p b?n hm nay! C?m ?n b?n ? ch?n [facility].   Vui lng mang theo T?T C? cc lo?i thu?c c?a b?n khi ??n khm.   Hm nay chng ta ? ni v?:  1. A1c c?a b?n l 8,1. Ti lo r?ng b?n b? h? ???ng huy?t qu nhi?u v chng ti c?n ph?i gi?m li?u insulin  tc d?ng ko di c?a b?n. Vui lng ch? dng 5 ??n v? Lantus  m?i ngy. Ti mu?n b?n dng 6 ??n v? Humalog  tr??c b?a tr?a v tr??c b?a t?i m?i ngy.  2. ??i v?i ch?ng ?au vai c?a b?n, ti khuyn b?n nn s? d?ng gel Voltaren t?i ?a 4 l?n m?i ngy. Ti c?ng ?ang vi?t cho b?n m?t ghi ch ?? s?p x?p cng vi?c v b?n c?n ngh? ng?i ?? tnh tr?ng ny thuyn gi?m. . 3. Vui lng g?i cho bc s? chuyn khoa tiu ha ?? ??t l?ch h?n t? v?n v? n?i soi ??i trng. Thng tin ? ?y l:  [facility] [address] [place] [phone]  Vui lng theo di sau 3 thng ?? ???c ch?m [place] b?nh ti?u ???ng  Chng ti ?ang ki?m tra m?t s? xt nghi?m hm nay. N?u b?t th??ng, ti s? g?i cho b?n. N?u bnh th??ng, ti s? g?i cho b?n tin nh?n MyChart (n?u ?ang ho?t ??ng) ho?c th? qua ???ng b?u ?i?n. N?u b?n khng nh?n ???c thng tin v? xt nghi?m c?a mnh trong 2 tu?n t?i, vui lng g?i ??n phng khm.  G?i ??n phng khm theo s? [phone] n?u cc tri?u ch?ng c?a b?n tr? nn t?i t? h?n ho?c b?n c b?t k? lo l?ng no.  Vui lng ??m b?o ln l?ch theo di t?i qu?y l? tn tr??c khi b?n r?i ?i hm nay.  Jonne Netters, DO Y h?c gia ?nh  It was wonderful to see you today! Thank you for choosing [facility].   Please bring ALL of your medications with you to every visit.   Today we talked about:  Your A1c is 8.1. I am worried that you are having too many low blood sugars and we need to go down on your long acting insulin . Please only take 5 units of Lantus  every day. I would like you to take 6 units of the Humalog  before lunch and before dinner every day. For your shoulder pain, I would recommend you use the Voltaren  gel up to 4 times per day. I am also writing you a note for work accomodation as you need rest for this to get better. . Please call the GI doctor to set up an appointment for consultation about a colonoscopy. There information is:  [facility] [address] [place]ada Herrlich [phone]  Please follow up in 3 months for diabetes care   We are checking some labs today. If they are abnormal, I will call you. If they are normal, I will send you a MyChart message (if it is active) or a letter in the mail. If you do not hear about your labs in the next 2 weeks, please call the office.  Call the clinic at [phone] if your symptoms worsen or you have any concerns.  Please be sure to schedule follow up at the front desk before you leave today.   Jonne Netters, DO Family Medicine

## 2023-09-18 ENCOUNTER — Other Ambulatory Visit: Payer: Self-pay | Admitting: Family Medicine

## 2023-09-19 ENCOUNTER — Other Ambulatory Visit: Payer: Self-pay | Admitting: Family Medicine

## 2023-09-19 DIAGNOSIS — E119 Type 2 diabetes mellitus without complications: Secondary | ICD-10-CM

## 2023-10-08 ENCOUNTER — Other Ambulatory Visit: Payer: Self-pay | Admitting: Family Medicine

## 2023-10-08 DIAGNOSIS — E785 Hyperlipidemia, unspecified: Secondary | ICD-10-CM

## 2023-10-08 DIAGNOSIS — E1169 Type 2 diabetes mellitus with other specified complication: Secondary | ICD-10-CM

## 2023-11-28 ENCOUNTER — Other Ambulatory Visit: Payer: Self-pay | Admitting: Family Medicine

## 2023-11-28 DIAGNOSIS — E1169 Type 2 diabetes mellitus with other specified complication: Secondary | ICD-10-CM

## 2023-12-01 ENCOUNTER — Telehealth: Payer: Self-pay

## 2023-12-01 NOTE — Telephone Encounter (Signed)
 Received VM from patient's niece requesting returned call to schedule diabetes follow up.   Returned call to niece. She reports that patient's blood sugar levels at 1500 were 41. She has been eating rice with vegetables and drinking orange soda. Blood sugar is now only 52. Patient does not   She reports only current symptom is fatigue. Denies jitteriness, lightheadedness, dizziness or diaphoresis.   I advised that due to persistent low blood sugar levels, patient should be evaluated in the ED.   Niece agrees to ED evaluation. Scheduled visit with PCP for diabetes follow up for 12/21/23.   Chiquita JAYSON English, RN

## 2023-12-21 ENCOUNTER — Ambulatory Visit (INDEPENDENT_AMBULATORY_CARE_PROVIDER_SITE_OTHER): Admitting: Family Medicine

## 2023-12-21 ENCOUNTER — Encounter: Payer: Self-pay | Admitting: Family Medicine

## 2023-12-21 VITALS — BP 120/75 | HR 77 | Wt 175.4 lb

## 2023-12-21 DIAGNOSIS — E1169 Type 2 diabetes mellitus with other specified complication: Secondary | ICD-10-CM

## 2023-12-21 DIAGNOSIS — G8929 Other chronic pain: Secondary | ICD-10-CM | POA: Diagnosis not present

## 2023-12-21 DIAGNOSIS — E785 Hyperlipidemia, unspecified: Secondary | ICD-10-CM | POA: Diagnosis not present

## 2023-12-21 DIAGNOSIS — M25511 Pain in right shoulder: Secondary | ICD-10-CM

## 2023-12-21 DIAGNOSIS — M25512 Pain in left shoulder: Secondary | ICD-10-CM | POA: Diagnosis not present

## 2023-12-21 DIAGNOSIS — E119 Type 2 diabetes mellitus without complications: Secondary | ICD-10-CM

## 2023-12-21 LAB — POCT GLYCOSYLATED HEMOGLOBIN (HGB A1C): HbA1c, POC (controlled diabetic range): 7.8 % — AB (ref 0.0–7.0)

## 2023-12-21 MED ORDER — INSULIN GLARGINE 100 UNIT/ML SOLOSTAR PEN
5.0000 [IU] | PEN_INJECTOR | SUBCUTANEOUS | 11 refills | Status: DC
Start: 2023-12-21 — End: 2024-03-13

## 2023-12-21 MED ORDER — METHYLPREDNISOLONE ACETATE 40 MG/ML IJ SUSP
40.0000 mg | Freq: Once | INTRAMUSCULAR | Status: AC
Start: 2023-12-21 — End: 2023-12-21
  Administered 2023-12-21: 40 mg via INTRAMUSCULAR

## 2023-12-21 MED ORDER — METFORMIN HCL 1000 MG PO TABS: 1000.0000 mg | ORAL_TABLET | Freq: Two times a day (BID) | ORAL | 1 refills | Status: DC

## 2023-12-21 MED ORDER — ROSUVASTATIN CALCIUM 10 MG PO TABS: 10.0000 mg | ORAL_TABLET | Freq: Every day | ORAL | 1 refills | Status: DC

## 2023-12-21 MED ORDER — INSULIN LISPRO (1 UNIT DIAL) 100 UNIT/ML (KWIKPEN): 4.0000 [IU] | PEN_INJECTOR | Freq: Two times a day (BID) | SUBCUTANEOUS | 3 refills | Status: DC
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
## 2023-12-21 NOTE — Assessment & Plan Note (Signed)
 A1c 7.8, improved from 8.1 in 08/2023.  CGM reviewed, frequently having low BGL, particularly late in the afternoon and overnight.  Will move Lantus  dosing to the a.m. and decrease Humalog  to 4 units twice daily.  May need further reduction given frequency of lows. -Take Lantus  5 units in the a.m. -Decrease to Humalog  4 units twice daily (advised to take before lunch and before dinner) -Refill metformin 

## 2023-12-21 NOTE — Patient Instructions (Addendum)
 Th?t tuy?t v?i khi ???c g?p b?n hm nay! C?m ?n b?n ? ch?n [facility].  Vui lng mang theo T?T C? cc lo?i thu?c c?a b?n m?i l?n khm.  Hm nay chng ta ? th?o lu?n v?:  1. Ti mu?n b?n u?ng Lantus  vo bu?i sng s?m thay v bu?i t?i. Ti c?ng mu?n b?n ch? u?ng 4 ??n v? Humalog , hai l?n m?i ngy. Ti mu?n b?n u?ng li?u bu?i sng mu?n h?n, c th? tr??c gi? ?n tr?a, v sau ? u?ng li?u bu?i t?i lc 6 gi? chi?u nh? bnh th??ng. Vui lng chuy?n c?m bi?n glucose sang tay bn kia v b?n c th? bi hydrocortisone ln vng b? ng?a ? cnh tay ph?i. N?u b?n v?n th??ng xuyn b? h? ???ng huy?t, vui lng quay l?i g?p ti s?m h?n ?? chng ti c th? ?i?u ch?nh thm insulin  cho b?n.  2. Hm nay ti s? tim cho b?n m?t m?i ? vai tri. Vui lng ti?p t?c c? g?ng ngh? ng?i vai cng nhi?u cng t?t, s? d?ng gel Tylenol v Voltaren  ?? gi?m ?au. V chng ti ch?a ch?p X-quang vai c?a b?n, ti mu?n ch?p X-quang vai tri v ph?i c?a b?n. Vui lng ??n [address] [place] ?? ch?p X-quang v ti s? lin h? l?i v?i b?n v? k?t qu?SABRA Sake lng ti khm sau 6 tu?n.  Hy g?i cho phng khm theo s? [phone] n?u cc tri?u ch?ng c?a b?n tr? n?ng h?n ho?c b?n c b?t k? lo l?ng no.  Vui lng ??t l?ch ti khm t?i qu?y l? tn tr??c khi b?n r?i ?i hm nay.  Izetta Nap, DO [facility] [place] ?nh  Tim kh?p, Ch?m [place] sau khi tim Vui lng tham kh?o t? h??ng d?n ny trong vi tu?n t?i. H??ng d?n ny cung c?p cho b?n thng tin v? vi?c ch?m [place] b?n thn sau khi tim. Bc s? c?ng c th? cung c?p cho b?n h??ng d?n c? th? h?n. Vi?c ?i?u tr? c?a b?n ? ???c ln k? ho?ch theo cc thng l? y t? hi?n hnh, nh?ng ?i khi v?n x?y ra v?n ??. Hy lin h? v?i bc s? n?u b?n c b?t k? v?n ?? ho?c th?c m?c no sau khi tim. Ti c th? g?p ph?i nh?ng v?n ?? g sau khi tim? Sau khi tim, th??ng s? c: ? ?au nh?c. ? Nng. ? S?ng.  B?n c th? b? ?au, s?ng v nng nhi?u h?n so v?i tr??c khi tim. Ph?n ?ng ny c th? ko di  kho?ng m?t ngy.  Th?c hi?n theo cc h??ng d?n sau t?i nh:  Ki?m sot ?au, c?ng v s?ng ? N?u ???c h??ng d?n, hy ch??m ? ln vng tim: ? Cho ? vo ti ni lng. ? ??t m?t chi?c kh?n gi?a da v ti. ? Ch??m ? trong 20 pht, 2-3 l?n m?i ngy. ? Khng ch??m nng ln ??u g?i. ? Nng vng tim ln cao h?n tim khi b?n ?ang ng?i ho?c n?m. Ho?t ??ng ? Rudi cc ho?t ??ng g?ng s?c trong th?i gian theo ch? ??nh c?a bc s?SABRA [place]ukes h?i bc s? khi no b?n c th? tr? l?i cc ho?t ??ng bnh th??ng.  Hy lin h? v?i nh cung c?p d?ch v? ch?m [place] s?c kh?e n?u: ? B?n c cc tri?u ch?ng t?i v? tr tim ko di h?n hai ngy sau khi tim. ? B?n b? ??, s?ng ho?c ?au ? vng tim. ? B?n c d?ch, mu ho?c m? ch?y ra t?  v? tr tim. ? B?n b? nng ? vng tim. ? B?n b? s?t. ? B?n khng th? ki?m sot c?n ?au b?ng thu?c.   It was wonderful to see you today! Thank you for choosing [facility].   Please bring ALL of your medications with you to every visit.   Today we talked about:  I would like you to take the Lantus  first thing in the morning instead of in the evening.  I would also like you to only take 4 units of the Humalog  twice per day.  I would like you to take the morning dose later in the morning, possibly before lunchtime and then take your evening dose at 6 PM as you have been.  Please switch your glucose sensor to the other arm and you can use some topical hydrocortisone on the area that is itchy on your right arm.  If you are still having frequent low blood sugars, please come back and see me sooner so we can further adjust your insulin . I am providing you with an injection in your left shoulder today.  Please continue to try to rest your shoulder is much as possible, use Tylenol and Voltaren  gel for pain relief.  Since we have not had imaging done of your shoulders in our system, I would like to get an x-ray done of your left and right shoulder.  Please go to [address]  [place] imaging to have this done and I will follow-up with you regarding the results.  Please follow up in 6 weeks   Call the clinic at [phone] if your symptoms worsen or you have any concerns.  Please be sure to schedule follow up at the front desk before you leave today.   Izetta Nap, DO Family Medicine    Joint Injection, Care After Refer to this sheet in the next few weeks. These instructions provide you with information about caring for yourself after your procedure. Your health care provider may also give you more specific instructions. Your treatment has been planned according to current medical practices, but problems sometimes occur. Call your health care provider if you have any problems or questions after your procedure. What can I expect after the procedure? After the procedure, it is common to have: Soreness. Warmth. Swelling. You may have more pain, swelling, and warmth than you did before the injection. This reaction may last for about one day. Follow these instructions at home: Managing pain, stiffness, and swelling If directed, apply ice to the injection area: Put ice in a plastic bag. Place a towel between your skin and the bag. Leave the ice on for 20 minutes, 2-3 times per day. Do not apply heat to your knee. Raise the injection area above the level of your heart while you are sitting or lying down. Activity Avoid strenuous activities for as long as directed by your health care provider. Ask your health care provider when you can return to your normal activities. General instructions Take medicines only as directed by your health care provider. Do not take aspirin or other over-the-counter medicines unless your health care provider says you can. Check your injection site every day for signs of infection. Watch for: Redness, swelling, or pain. Fluid, blood, or pus. Follow your health care provider's instructions about dressing changes and  removal. Contact a health care provider if: You have symptoms at your injection site that last longer than two days after your procedure. You have redness, swelling, or pain in  your injection area. You have fluid, blood, or pus coming from your injection site. You have warmth in your injection area. You have a fever. Your pain is not controlled with medicine.

## 2023-12-21 NOTE — Assessment & Plan Note (Signed)
 Refill rosuvastatin  10 mg daily. -Consider lipid panel at follow-up

## 2024-01-02 NOTE — Progress Notes (Unsigned)
    SUBJECTIVE:   CHIEF COMPLAINT / HPI:   Shoulder injection?  Diabetes Current Regimen: Lantus  5U daily, Humalog  64U BID, Metformin  1000 mg twice daily, Jardiance  25 mg daily, Ozempic  2mg  weekly  CBGs:  Last A1c:  Recent Labs       Lab Results  Component Value Date    HGBA1C 7.8 (A) 12/21/2023      Last Eye Exam: Referred previously Statin: Rosuvastatin  10mg  daily ACE/ARB: Losartan  50mg    PERTINENT  PMH / PSH: HTN, GERD, T2DM, HLD   OBJECTIVE:   There were no vitals taken for this visit. ***  General: NAD, pleasant, able to participate in exam Cardiac: RRR, no murmurs. Respiratory: CTAB, normal effort, No wheezes, rales or rhonchi Abdomen: Bowel sounds present, nontender, nondistended Extremities: no edema or cyanosis. Skin: warm and dry, no rashes noted Neuro: alert, no obvious focal deficits Psych: Normal affect and mood  ASSESSMENT/PLAN:   No problem-specific Assessment & Plan notes found for this encounter.     Dr. Izetta Nap, DO [facility]    {    This will disappear when note is signed, click to select method of visit    :1}

## 2024-01-03 ENCOUNTER — Ambulatory Visit (INDEPENDENT_AMBULATORY_CARE_PROVIDER_SITE_OTHER): Admitting: Family Medicine

## 2024-01-03 ENCOUNTER — Encounter: Payer: Self-pay | Admitting: Family Medicine

## 2024-01-03 VITALS — BP 108/75 | HR 74 | Ht 61.5 in | Wt 172.0 lb

## 2024-01-03 DIAGNOSIS — M25511 Pain in right shoulder: Secondary | ICD-10-CM | POA: Diagnosis not present

## 2024-01-03 DIAGNOSIS — M25512 Pain in left shoulder: Secondary | ICD-10-CM

## 2024-01-03 DIAGNOSIS — Z1211 Encounter for screening for malignant neoplasm of colon: Secondary | ICD-10-CM

## 2024-01-03 DIAGNOSIS — R809 Proteinuria, unspecified: Secondary | ICD-10-CM | POA: Diagnosis not present

## 2024-01-03 DIAGNOSIS — E1129 Type 2 diabetes mellitus with other diabetic kidney complication: Secondary | ICD-10-CM

## 2024-01-03 DIAGNOSIS — Z794 Long term (current) use of insulin: Secondary | ICD-10-CM

## 2024-01-03 DIAGNOSIS — G8929 Other chronic pain: Secondary | ICD-10-CM

## 2024-01-03 NOTE — Patient Instructions (Addendum)
 Th?t tuy?t v?i khi ???c g?p b?n hm nay! C?m ?n b?n ? l?a ch?n [facility].  Vui lng mang theo T?T C? cc lo?i thu?c c?a b?n m?i l?n khm.  Hm nay chng ta ? th?o lu?n v?:  1. Vui lng u?ng Humalog  lc 12-1 gi? chi?u v u?ng l?i vo bu?i t?i nh? b?n v?n th??ng lm. Vui lng c?ng u?ng Lantus  vo bu?i sng. L?ch trnh c?a b?n nn nh? sau: a. B?a sng: Latus 5 ??n v? b. B?a tr?a: Humalog  4 ??n v? c. B?a t?i: Humalog  4 ??n v? 2. B?n ???c php ??n [facility] ?? khm m?t cho b?nh nhn ti?u ???ng. Vui lng g?i s? ?i?n tho?i bn d??i ?? ???c ?nh gi.  [facility] [address] [place] [phone]  3. Vui lng g?i cho bc s? chuyn khoa Tiu ha theo thng tin bn d??i ?? ??t l?ch n?i soi ??i trng.  Phng khm Tiu ha [place] [address] [place] [phone]  Vui lng ti khm sau 2 thng  Hy g?i cho phng khm theo s? [phone] n?u cc tri?u ch?ng c?a b?n tr? n?ng h?n ho?c b?n c b?t k? lo ng?i no.  Vui lng ??t l?ch ti khm t?i qu?y l? tn tr??c khi b?n r?i ?i hm nay.  Morgan Nap, DO [facility] [place] ?nh   It was wonderful to see you today! Thank you for choosing [facility].   Please bring ALL of your medications with you to every visit.   Today we talked about:  Please take your Humalog  at 12-1PM and again at night as you have been. Please also take the Lantus  in the morning. Your schedule should look like: Breakfast: Latus 5 units Lunch: Humalog  4 units Dinner: Humalog  4 units You are approved to go to [facility] for a diabetic eye exam. Please call the number below for evaluation.   [facility] [address] [place]  [phone]  3. Please call the Gi doctor using the information below to schedule a colonoscopy.   Gastroenterology [address] [place] [phone]  Please follow up in 2 months   Call the clinic at [phone] if your symptoms worsen or you have any concerns.  Please be sure to schedule follow up at the  front desk before you leave today.   Morgan Nap, DO Family Medicine

## 2024-01-18 ENCOUNTER — Other Ambulatory Visit: Payer: Self-pay | Admitting: Family Medicine

## 2024-01-19 ENCOUNTER — Encounter: Payer: Self-pay | Admitting: Gastroenterology

## 2024-01-24 ENCOUNTER — Ambulatory Visit: Admitting: Family Medicine

## 2024-02-01 ENCOUNTER — Other Ambulatory Visit: Payer: Self-pay | Admitting: Family Medicine

## 2024-02-01 DIAGNOSIS — E1165 Type 2 diabetes mellitus with hyperglycemia: Secondary | ICD-10-CM

## 2024-02-01 DIAGNOSIS — E1169 Type 2 diabetes mellitus with other specified complication: Secondary | ICD-10-CM

## 2024-02-02 ENCOUNTER — Ambulatory Visit (HOSPITAL_COMMUNITY)
Admission: EM | Admit: 2024-02-02 | Discharge: 2024-02-02 | Disposition: A | Discharge status: Home / Self Care | Attending: Internal Medicine | Admitting: Internal Medicine

## 2024-02-02 ENCOUNTER — Ambulatory Visit (AMBULATORY_SURGERY_CENTER)

## 2024-02-02 ENCOUNTER — Encounter (HOSPITAL_COMMUNITY): Payer: Self-pay

## 2024-02-02 VITALS — Ht 61.5 in | Wt 172.0 lb

## 2024-02-02 DIAGNOSIS — Z1211 Encounter for screening for malignant neoplasm of colon: Secondary | ICD-10-CM

## 2024-02-02 DIAGNOSIS — J309 Allergic rhinitis, unspecified: Secondary | ICD-10-CM | POA: Diagnosis not present

## 2024-02-02 DIAGNOSIS — H1013 Acute atopic conjunctivitis, bilateral: Secondary | ICD-10-CM

## 2024-02-02 MED ORDER — AZELASTINE HCL 0.05 % OP SOLN: 1.0000 [drp] | Freq: Two times a day (BID) | OPHTHALMIC | 0 refills | Status: DC

## 2024-02-02 MED ORDER — FLUORESCEIN SODIUM 1 MG OP STRP
ORAL_STRIP | OPHTHALMIC | Status: AC
  Filled 2024-02-02: qty 1

## 2024-02-02 MED ORDER — TETRACAINE HCL 0.5 % OP SOLN
OPHTHALMIC | Status: AC
  Filled 2024-02-02: qty 4

## 2024-02-02 MED ORDER — NA SULFATE-K SULFATE-MG SULF 17.5-3.13-1.6 GM/177ML PO SOLN: 1.0000 | Freq: Once | ORAL | 0 refills | Status: DC

## 2024-02-02 NOTE — ED Triage Notes (Addendum)
 Interpreter: [phone] Morgan Osborne  Patient states she thinks dust got into her eyes a few days ago. States now having eye irritation,pain, and blurred vision. Patient states she does normally wear glasses.   Has tried otc eye drops with no relief.

## 2024-02-02 NOTE — Progress Notes (Signed)
 Pre visit completed via phone call with interpreter present; Patient verified name, DOB, and address; No egg or soy allergy known to patient;  No issues known to pt with past sedation with any surgeries or procedures; Patient denies ever being told they had issues or difficulty with intubation;  No FH of Malignant Hyperthermia; Pt is not on diet pills; Pt is not on home 02;  Pt is not on blood thinners;  Pt denies issues with constipation;  No A fib or A flutter; Have any cardiac testing pending--NO Insurance verified during PV appt--- BCBS PPO Pt can ambulate without assistance;  Pt denies use of chewing tobacco; Discussed diabetic/weight loss medication holds; Discussed NSAID holds; Checked BMI to be less than 50; Pt instructed to use Singlecare.com or GoodRx for a price reduction on prep;  Patient's chart reviewed by Norleen Schillings CNRA prior to previsit and patient appropriate for the [facility]; Pre visit completed and red dot placed by patient's name on their procedure day (on provider's schedule);   Instructions printed and given to the interpreter for review with the patient;

## 2024-02-02 NOTE — Discharge Instructions (Signed)
 Make sure you get your eyes checked with a eye doctor because you need glasses.

## 2024-02-02 NOTE — ED Provider Notes (Signed)
 [facility]    CSN: [id number] Arrival date & time: 02/02/24  1127      History   Chief Complaint Chief Complaint  Patient presents with   Eye Pain    HPI Morgan Osborne is a 48 y.o. female who presents with R eye eye irritation which started after she looked up at her garden and some dust went into this eye. This eye has been tearing off and on, then today noticed or feels like some kind of animal is in her eye lids due to itching this eye a lot. She has been sneezing. She has also noticed blurred vision, x 2 days. Has tried OTC eye drops and has not helped. She does not wear glasses. She denies R side rhinitis or photophobia. Denies yellow drainage from her eyes, just gets some yellow crusting in the am when she wakes up on the tear duct area.      Patient Active Problem List   Diagnosis Date Noted   Mass of lower outer quadrant of left breast 03/13/2023   Depressed mood 03/22/2022   Murmur, cardiac 09/22/2021   Essential hypertension 09/22/2021   Breakthrough bleeding on Nexplanon  09/22/2021   Nexplanon  in place 09/22/2021   Hyperlipidemia associated with type 2 diabetes mellitus (HCC) 11/13/2020   GERD (gastroesophageal reflux disease) 08/13/2020   Diabetes mellitus without complication (HCC) 11/25/2019   Birth control counseling 11/25/2019    Past Surgical History:  Procedure Laterality Date   CESAREAN SECTION     x 2    OB History   No obstetric history on file.      Home Medications    Prior to Admission medications   Medication Sig Start Date End Date Taking? Authorizing Provider  azelastine  (OPTIVAR ) 0.05 % ophthalmic solution Place 1 drop into both eyes 2 (two) times daily. 02/02/24  Yes Rodriguez-Southworth, Kyra, PA-C  Continuous Glucose Sensor (FREESTYLE LIBRE 3 PLUS SENSOR) MISC PLACE ONE SENSOR ON THE SKIN EVERY 15 DAYS. USE TO CHECK GLUCOSE CONTINUOUSLY 09/19/23  Yes Theophilus Pagan, MD  empagliflozin  (JARDIANCE ) 25 MG TABS tablet Take 1  tablet by mouth once daily 02/01/24  Yes Theophilus Pagan, MD  insulin  glargine (LANTUS ) 100 UNIT/ML Solostar Pen Inject 5 Units into the skin every morning. 12/21/23  Yes Theophilus Pagan, MD  insulin  lispro (HUMALOG ) 100 UNIT/ML KwikPen Inject 4 Units into the skin 2 (two) times daily. 12/21/23  Yes Theophilus Pagan, MD  losartan -hydrochlorothiazide  (HYZAAR ) 50-12.5 MG tablet Take 1 tablet by mouth daily. 06/21/23  Yes Theophilus Pagan, MD  metFORMIN  (GLUCOPHAGE ) 1000 MG tablet Take 1 tablet (1,000 mg total) by mouth 2 (two) times daily. 12/21/23  Yes Theophilus Pagan, MD  rosuvastatin  (CRESTOR ) 10 MG tablet Take 1 tablet (10 mg total) by mouth daily. 12/21/23  Yes Theophilus Pagan, MD  Semaglutide , 2 MG/DOSE, (OZEMPIC , 2 MG/DOSE,) 8 MG/3ML SOPN INJECT 2 MG SUBCUTANEOUSLY ONCE A WEEK 01/18/24  Yes Theophilus Pagan, MD  diclofenac  Sodium (VOLTAREN ) 1 % GEL Apply 2 g topically 4 (four) times daily. Patient taking differently: Apply 2 g topically 4 (four) times daily as needed. 09/04/23   Theophilus Pagan, MD  Insulin  Pen Needle 31G X 5 MM MISC USE AS DIRECTED 10/27/21   Patel, Poonamkumari J, MD  triamcinolone  (KENALOG ) 0.025 % ointment APPLY 1 APPLICATION TOPICALLY TWICE DAILY 09/12/22   Espinoza, Alejandra, DO    Family History Family History  Problem Relation Age of Onset   Thyroid cancer Father 51   BRCA 1/2 Neg Hx  Breast cancer Neg Hx    Colon polyps Neg Hx    Colon cancer Neg Hx    Prostate cancer Neg Hx    Rectal cancer Neg Hx    Esophageal cancer Neg Hx    Stomach cancer Neg Hx     Social History Social History   Tobacco Use   Smoking status: Never    Passive exposure: Never   Smokeless tobacco: Never  Vaping Use   Vaping status: Never Used  Substance Use Topics   Alcohol use: No   Drug use: No     Allergies   Patient has no known allergies.   Review of Systems Review of Systems As noted in HPI  Physical Exam Triage Vital Signs ED Triage Vitals  Encounter Vitals  Group     BP 02/02/24 1200 104/69     Girls Systolic BP Percentile --      Girls Diastolic BP Percentile --      Boys Systolic BP Percentile --      Boys Diastolic BP Percentile --      Pulse Rate 02/02/24 1200 79     Resp 02/02/24 1200 18     Temp 02/02/24 1200 98.3 F (36.8 C)     Temp Source 02/02/24 1200 Oral     SpO2 02/02/24 1200 97 %     Weight --      Height --      Head Circumference --      Peak Flow --      Pain Score 02/02/24 1156 5     Pain Loc --      Pain Education --      Exclude from Growth Chart --    No data found.  Updated Vital Signs BP 104/69 (BP Location: Left Arm)   Pulse 79   Temp 98.3 F (36.8 C) (Oral)   Resp 18   LMP 12/08/2023 (Approximate)   SpO2 97%   Visual Acuity Right Eye Distance: 20/50 (without correction) Left Eye Distance: 20/40 (without correction) Bilateral Distance: 20/50 (without correction)  Right Eye Near:   Left Eye Near:    Bilateral Near:     Physical Exam Vitals and nursing note reviewed.  Constitutional:      General: She is not in acute distress.    Appearance: Normal appearance. She is not toxic-appearing.  Eyes:     General: Lids are normal. No scleral icterus.       Right eye: No foreign body, discharge or hordeolum.        Left eye: No discharge or hordeolum.     Extraocular Movements: Extraocular movements intact.     Conjunctiva/sclera: Conjunctivae normal.     Pupils: Pupils are equal, round, and reactive to light.     Slit lamp exam:    Right eye: No corneal flare, corneal ulcer, foreign body or photophobia.     Comments: Neither eye is tearing during the visit  Pulmonary:     Effort: Pulmonary effort is normal.  Musculoskeletal:        General: Normal range of motion.     Cervical back: Neck supple.  Skin:    General: Skin is warm and dry.  Neurological:     Mental Status: She is alert and oriented to person, place, and time.     Gait: Gait normal.  Psychiatric:        Mood and Affect: Mood  normal.        Behavior: Behavior normal.  Thought Content: Thought content normal.        Judgment: Judgment normal.    UC Treatments / Results  Labs (all labs ordered are listed, but only abnormal results are displayed) Labs Reviewed - No data to display  EKG   Radiology No results found.  Procedures Procedures (including critical care time)  Medications Ordered in UC Medications - No data to display  Initial Impression / Assessment and Plan / UC Course  I have reviewed the triage vital signs and the nursing notes.  She may have had irritation in her R eye from the dust, but today there is no signs of corneal abrasion. Due to her sneezing and itching of eye, I believe she has allergic conjunctivitis. I placed her on Optivar  eye drops as noted.  Her vision is poor, so she was also told she needs to FU with an eye doctor.     Final Clinical Impressions(s) / UC Diagnoses   Final diagnoses:  Allergic conjunctivitis and rhinitis, bilateral     Discharge Instructions      Make sure you get your eyes checked with a eye doctor because you need glasses.        ED Prescriptions     Medication Sig Dispense Auth. Provider   azelastine  (OPTIVAR ) 0.05 % ophthalmic solution Place 1 drop into both eyes 2 (two) times daily. 6 mL Rodriguez-Southworth, Kyra, PA-C      PDMP not reviewed this encounter.   Lindi Kyra, [place] 02/02/24 1243

## 2024-02-05 ENCOUNTER — Encounter: Payer: Self-pay | Admitting: Gastroenterology

## 2024-02-15 DIAGNOSIS — H2513 Age-related nuclear cataract, bilateral: Secondary | ICD-10-CM | POA: Diagnosis not present

## 2024-02-23 ENCOUNTER — Ambulatory Visit (AMBULATORY_SURGERY_CENTER): Admitting: Gastroenterology

## 2024-02-23 ENCOUNTER — Encounter: Payer: Self-pay | Admitting: Gastroenterology

## 2024-02-23 VITALS — BP 108/72 | HR 78 | Temp 97.4°F | Resp 18 | Ht 61.5 in | Wt 172.0 lb

## 2024-02-23 DIAGNOSIS — Z1211 Encounter for screening for malignant neoplasm of colon: Secondary | ICD-10-CM | POA: Diagnosis not present

## 2024-02-23 MED ORDER — SODIUM CHLORIDE 0.9 % IV SOLN: 500.0000 mL | Freq: Once | INTRAVENOUS | Status: DC

## 2024-02-23 NOTE — Progress Notes (Signed)
 [facility] History and Physical   Primary Care Physician:  Theophilus Pagan, MD   Reason for Procedure:   Colon cancer screening  Plan:    Screening colonoscopy     HPI: Morgan Osborne is a 48 y.o. female undergoing initial average risk screening colonoscopy.  She has no family history of colon cancer and no chronic GI symptoms.    Past Medical History:  Diagnosis Date   Diabetes mellitus without complication (HCC)    Hyperlipidemia    Hypertension     Past Surgical History:  Procedure Laterality Date   CESAREAN SECTION     x 2    Prior to Admission medications   Medication Sig Start Date End Date Taking? Authorizing Provider  azelastine  (OPTIVAR ) 0.05 % ophthalmic solution Place 1 drop into both eyes 2 (two) times daily. 02/02/24  Yes Rodriguez-Southworth, Kyra, PA-C  Continuous Glucose Sensor (FREESTYLE LIBRE 3 PLUS SENSOR) MISC PLACE ONE SENSOR ON THE SKIN EVERY 15 DAYS. USE TO CHECK GLUCOSE CONTINUOUSLY 09/19/23  Yes Theophilus Pagan, MD  empagliflozin  (JARDIANCE ) 25 MG TABS tablet Take 1 tablet by mouth once daily 02/01/24  Yes Theophilus Pagan, MD  losartan -hydrochlorothiazide  (HYZAAR ) 50-12.5 MG tablet Take 1 tablet by mouth daily. 06/21/23  Yes Theophilus Pagan, MD  metFORMIN  (GLUCOPHAGE ) 1000 MG tablet Take 1 tablet (1,000 mg total) by mouth 2 (two) times daily. 12/21/23  Yes Theophilus Pagan, MD  diclofenac  Sodium (VOLTAREN ) 1 % GEL Apply 2 g topically 4 (four) times daily. Patient taking differently: Apply 2 g topically 4 (four) times daily as needed. 09/04/23   Theophilus Pagan, MD  insulin  glargine (LANTUS ) 100 UNIT/ML Solostar Pen Inject 5 Units into the skin every morning. 12/21/23   Theophilus Pagan, MD  insulin  lispro (HUMALOG ) 100 UNIT/ML KwikPen Inject 4 Units into the skin 2 (two) times daily. 12/21/23   Theophilus Pagan, MD  Insulin  Pen Needle 31G X 5 MM MISC USE AS DIRECTED 10/27/21   Tobie Lola PARAS, MD  rosuvastatin  (CRESTOR ) 10 MG tablet Take  1 tablet (10 mg total) by mouth daily. 12/21/23   Theophilus Pagan, MD  Semaglutide , 2 MG/DOSE, (OZEMPIC , 2 MG/DOSE,) 8 MG/3ML SOPN INJECT 2 MG SUBCUTANEOUSLY ONCE A WEEK 01/18/24   Theophilus Pagan, MD  triamcinolone  (KENALOG ) 0.025 % ointment APPLY 1 APPLICATION TOPICALLY TWICE DAILY 09/12/22   Espinoza, Alejandra, DO    Current Outpatient Medications  Medication Sig Dispense Refill   azelastine  (OPTIVAR ) 0.05 % ophthalmic solution Place 1 drop into both eyes 2 (two) times daily. 6 mL 0   Continuous Glucose Sensor (FREESTYLE LIBRE 3 PLUS SENSOR) MISC PLACE ONE SENSOR ON THE SKIN EVERY 15 DAYS. USE TO CHECK GLUCOSE CONTINUOUSLY 2 each 8   empagliflozin  (JARDIANCE ) 25 MG TABS tablet Take 1 tablet by mouth once daily 90 tablet 1   losartan -hydrochlorothiazide  (HYZAAR ) 50-12.5 MG tablet Take 1 tablet by mouth daily. 90 tablet 3   metFORMIN  (GLUCOPHAGE ) 1000 MG tablet Take 1 tablet (1,000 mg total) by mouth 2 (two) times daily. 180 tablet 1   diclofenac  Sodium (VOLTAREN ) 1 % GEL Apply 2 g topically 4 (four) times daily. (Patient taking differently: Apply 2 g topically 4 (four) times daily as needed.) 150 g 3   insulin  glargine (LANTUS ) 100 UNIT/ML Solostar Pen Inject 5 Units into the skin every morning. 3 mL 11   insulin  lispro (HUMALOG ) 100 UNIT/ML KwikPen Inject 4 Units into the skin 2 (two) times daily. 6 mL 3   Insulin  Pen Needle 31G X 5 MM  MISC USE AS DIRECTED 100 each 1   rosuvastatin  (CRESTOR ) 10 MG tablet Take 1 tablet (10 mg total) by mouth daily. 90 tablet 1   Semaglutide , 2 MG/DOSE, (OZEMPIC , 2 MG/DOSE,) 8 MG/3ML SOPN INJECT 2 MG SUBCUTANEOUSLY ONCE A WEEK 3 mL 4   triamcinolone  (KENALOG ) 0.025 % ointment APPLY 1 APPLICATION TOPICALLY TWICE DAILY 30 g 0   Current Facility-Administered Medications  Medication Dose Route Frequency Provider Last Rate Last Admin   0.9 %  sodium chloride infusion  500 mL Intravenous Once Stacia Glendia BRAVO, MD        Allergies as of 02/23/2024   (No Known  Allergies)    Family History  Problem Relation Age of Onset   Thyroid cancer Father 23   BRCA 1/2 Neg Hx    Breast cancer Neg Hx    Colon polyps Neg Hx    Colon cancer Neg Hx    Prostate cancer Neg Hx    Rectal cancer Neg Hx    Esophageal cancer Neg Hx    Stomach cancer Neg Hx     Social History   Socioeconomic History   Marital status: Married    Spouse name: Not on file   Number of children: Not on file   Years of education: Not on file   Highest education level: Not on file  Occupational History   Not on file  Tobacco Use   Smoking status: Never    Passive exposure: Never   Smokeless tobacco: Never  Vaping Use   Vaping status: Never Used  Substance and Sexual Activity   Alcohol use: No   Drug use: No   Sexual activity: Yes  Other Topics Concern   Not on file  Social History Narrative   Not on file   Social Drivers of Health   Financial Resource Strain: Not on file  Food Insecurity: Not on file  Transportation Needs: Not on file  Physical Activity: Not on file  Stress: Not on file  Social Connections: Not on file  Intimate Partner Violence: Not on file    Review of Systems:  All other review of systems negative except as mentioned in the HPI.  Physical Exam: Vital signs BP 98/67   Pulse 84   Temp (!) 97.4 F (36.3 C)   Ht 5' 1.5 (1.562 m)   Wt 172 lb (78 kg)   LMP 12/08/2023 (Approximate)   SpO2 100%   BMI 31.97 kg/m   General:   Alert,  Well-developed, well-nourished, pleasant and cooperative in NAD Airway:  Mallampati 2 Lungs:  Clear throughout to auscultation.   Heart:  Regular rate and rhythm; no murmurs, clicks, rubs,  or gallops. Abdomen:  Soft, nontender and nondistended. Normal bowel sounds.   Neuro/Psych:  Normal mood and affect. A and O x 3   Briyah Wheelwright E. Stacia, MD [facility]

## 2024-02-23 NOTE — Progress Notes (Signed)
 Pt's states no medical or surgical changes since previsit or office visit.   Patient last dose of Ozempic  was 02/18/24. Dr. Stacia made aware.  Interpreter used today at the [facility] for this pt.  Interpreter's name is-

## 2024-02-23 NOTE — Patient Instructions (Signed)
 Resume previous diet.  Continue present medications.   Repeat colonoscopy in 10 years for screening purposes.   YOU HAD AN ENDOSCOPIC PROCEDURE TODAY AT THE [facility]:   Refer to the procedure report that was given to you for any specific questions about what was found during the examination.  If the procedure report does not answer your questions, please call your gastroenterologist to clarify.  If you requested that your care partner not be given the details of your procedure findings, then the procedure report has been included in a sealed envelope for you to review at your convenience later.  YOU SHOULD EXPECT: Some feelings of bloating in the abdomen. Passage of more gas than usual.  Walking can help get rid of the air that was put into your GI tract during the procedure and reduce the bloating. If you had a lower endoscopy (such as a colonoscopy or flexible sigmoidoscopy) you may notice spotting of blood in your stool or on the toilet paper. If you underwent a bowel prep for your procedure, you may not have a normal bowel movement for a few days.  Please Note:  You might notice some irritation and congestion in your nose or some drainage.  This is from the oxygen used during your procedure.  There is no need for concern and it should clear up in a day or so.  SYMPTOMS TO REPORT IMMEDIATELY:  Following lower endoscopy (colonoscopy or flexible sigmoidoscopy):  Excessive amounts of blood in the stool  Significant tenderness or worsening of abdominal pains  Swelling of the abdomen that is new, acute  Fever of 100F or higher  For urgent or emergent issues, a gastroenterologist can be reached at any hour by calling [phone]. Do not use MyChart messaging for urgent concerns.    DIET:  We do recommend a small meal at first, but then you may proceed to your regular diet.  Drink plenty of fluids but you should avoid alcoholic beverages for 24 hours.  ACTIVITY:  You should  plan to take it easy for the rest of today and you should NOT DRIVE or use heavy machinery until tomorrow (because of the sedation medicines used during the test).    FOLLOW UP: Our staff will call the number listed on your records the next business day following your procedure.  We will call around 7:15- 8:00 am to check on you and address any questions or concerns that you may have regarding the information given to you following your procedure. If we do not reach you, we will leave a message.     If any biopsies were taken you will be contacted by phone or by letter within the next 1-3 weeks.  Please call us  at [phone] if you have not heard about the biopsies in 3 weeks.    SIGNATURES/CONFIDENTIALITY: You and/or your care partner have signed paperwork which will be entered into your electronic medical record.  These signatures attest to the fact that that the information above on your After Visit Summary has been reviewed and is understood.  Full responsibility of the confidentiality of this discharge information lies with you and/or your care-partner.  HM NAY QU V? ? TH?C HI?N TH? THU?T N?I SOI T?I TRUNG TM N?I SOI [place]: Vui lng xem bo co th? thu?t ? ???c g?i cho qu v?, n?u qu v? c b?t k? th?c m?c g trong su?t qu trnh th?m khm. N?u bo co th? thu?t khng th? gi?i ?p th?c  m?c c?a qu v?, vui lng g?i cho bc s? chuyn khoa tiu ha c?a qu v? ?? ???c gi?i ?p. N?u qu v? ? yu c?u khng cung c?p thng tin chi ti?t v? k?t qu? th? thu?t cho ??i tc ch?m [place] c?a qu v?, th bo co th? thu?t s? ???c g?i trong m?t phong b ???c dn kn ?? qu v? xem khi thu?n ti?n.   QU V? C TH?: C?m gic ch??ng b?ng. Trung ti?n nhi?u h?n bnh th??ng. ?i b? c th? gip ??y ra ngoi khng kh ?i vo ???ng tiu ha trong khi th?c hi?n th? thu?t v gi?m ch??ng b?ng. N?u qu v? ti?n hnh n?i soi d??i (nh? n?i soi ??i trng ho?c soi k?t trng xch-ma b?ng ?ng m?m), qu v? c th? th?y cc ch?m mu  ? phn ho?c trn gi?y v? sinh. N?u qu v? ? lm s?ch ??i trng ?? th?c hi?n th? thu?t, qu v? c th? khng ?i ??i ti?n nh? bnh th??ng trong vi ngy.  Vui Lng L?u : Qu v? c th? b? kch ?ng v ngh?t m?i ho?c ch?y n??c m?i. Tnh tr?ng ny l do ?nh h??ng c?a vi?c th? bnh oxy trong qu trnh th?c hi?n th? thu?t. Qu v? khng c?n lo l?ng, tnh tr?ng ny s? bi?n m?t sau m?t ho?c vi ngy.   CC TRI?U CH?NG C?N BO CO NGAY  Sau khi th?c hi?n n?i soi d??i (n?i soi ??i trng ho?c soi k?t trng xch-ma b?ng ?ng m?m): Phn c nhi?u mu ?au b?ng d? d?i ho?c ngy cng t?ng Xu?t hi?n v?t s?ng b?ng m?i, c?p tnh S?t t? 100F tr? ln   Sau khi th?c hi?n n?i soi trn (EGD)  Nn ra mu ho?c ch?t mu c ph s?m Xu?t hi?n c?n ?au ng?c ho?c ?au d??i x??ng b? vai m?i Nu?t ?au ho?c kh nu?t M?i b? kh th? S?t t? 100F tr? ln Phn ?en nh? m?c  ??i v?i cc v?n ?? kh?n c?p ho?c c?p c?u, qu v? c th? lin h? bc s? chuyn khoa tiu ha b?t k? lc no b?ng cch g?i ??n s? [phone].   CH? ?? ?N U?NG: Chng ti [employer] v? tr??c tin nn ?n nh?, nh?ng sau ? qu v? c th? ?n theo ch? ?? bnh th??ng. U?ng nhi?u n??c nh?ng ph?i trnh ?? u?ng c c?n trong 24 gi?.  HO?T ??NG: Qu v? c?n ln k? ho?ch ?? ngh? ng?i trong ngy hm nay v KHNG NN LI XE ho?c s? d?ng my mc n?ng cho ??n ngy mai (do tc d?ng c?a thu?c an th?n s? d?ng trong th? thu?t).   THEO DI: Nhn vin c?a chng ti s? g?i cho qu v? theo s? ?i?n tho?i trong b?nh n vo ngy lm vi?c ti?p theo sau ngy th?c hi?n th? thu?t ?? ki?m tra tnh tr?ng c?a qu v? v gi?i ?p cc cu h?i ho?c th?c m?c c?a qu v? v? thng tin m qu v? ???c cung c?p sau khi th?c hi?n th? thu?t. N?u chng ti khng lin l?c ???c v?i qu v?, chng ti s? ?? l?i tin nh?n. Tuy nhin, n?u qu v? c?m th?y kh?e v khng g?p b?t k? s? c? no, qu v? khng c?n g?i l?i cho chng ti. Chng ti s? gi? ??nh r?ng qu v? ? tr? l?i sinh ho?t bnh th??ng v khng g?p b?t k? s? c? no.  N?u  qu v? ???c l?y sinh thi?t, chng ti s? lin l?c v?i  qu v? qua ?i?n tho?i ho?c th? trong 1-3 tu?n ti?p theo. Vui lng g?i cho chng ti theo s? [phone] n?u qu v? khng nh?n ???c thng tin v? k?t qu? sinh thi?t trong 3 tu?n.  CH? K/B?O M?TBETHA Do v? v/ho?c ??i tc ch?m [place] c?a qu v? ? k vo cc ti li?u s? ???c nh?p vo h? s? y t? ?i?n t? c?a qu v?. Ch? k ny xc nh?n r?ng cc thng tin trn ?y trong B?n Tm T?t Sau Khi Th?m Khm c?a qu v? ? ???c xem xt v hi?u r. Qu v? v/ho?c HM NAY QU V? ? TH?C HI?N TH? THU?T N?I SOI T?I TRUNG TM N?I SOI [place]: Vui lng xem bo co th? thu?t ? ???c g?i cho qu v?, n?u qu v? c b?t k? th?c m?c g trong su?t qu trnh th?m khm. N?u bo co th? thu?t khng th? gi?i ?p th?c m?c c?a qu v?, vui lng g?i cho bc s? chuyn khoa tiu ha c?a qu v? ?? ???c gi?i ?p. N?u qu v? ? yu c?u khng cung c?p thng tin chi ti?t v? k?t qu? th? thu?t cho ??i tc ch?m [place] c?a qu v?, th bo co th? thu?t s? ???c g?i trong m?t phong b ???c dn kn ?? qu v? xem khi thu?n ti?n.   QU V? C TH?: C?m gic ch??ng b?ng. Trung ti?n nhi?u h?n bnh th??ng. ?i b? c th? gip ??y ra ngoi khng kh ?i vo ???ng tiu ha trong khi th?c hi?n th? thu?t v gi?m ch??ng b?ng. N?u qu v? ti?n hnh n?i soi d??i (nh? n?i soi ??i trng ho?c soi k?t trng xch-ma b?ng ?ng m?m), qu v? c th? th?y cc ch?m mu ? phn ho?c trn gi?y v? sinh. N?u qu v? ? lm s?ch ??i trng ?? th?c hi?n th? thu?t, qu v? c th? khng ?i ??i ti?n nh? bnh th??ng trong vi ngy.  Vui Lng L?u : Qu v? c th? b? kch ?ng v ngh?t m?i ho?c ch?y n??c m?i. Tnh tr?ng ny l do ?nh h??ng c?a vi?c th? bnh oxy trong qu trnh th?c hi?n th? thu?t. Qu v? khng c?n lo l?ng, tnh tr?ng ny s? bi?n m?t sau m?t ho?c vi ngy.   CC TRI?U CH?NG C?N BO CO NGAY  Sau khi th?c hi?n n?i soi d??i (n?i soi ??i trng ho?c soi k?t trng xch-ma b?ng ?ng m?m): Phn c nhi?u mu ?au b?ng d? d?i ho?c ngy cng t?ng Xu?t  hi?n v?t s?ng b?ng m?i, c?p tnh S?t t? 100F tr? ln   Sau khi th?c hi?n n?i soi trn (EGD)  Nn ra mu ho?c ch?t mu c ph s?m Xu?t hi?n c?n ?au ng?c ho?c ?au d??i x??ng b? vai m?i Nu?t ?au ho?c kh nu?t M?i b? kh th? S?t t? 100F tr? ln Phn ?en nh? m?c  ??i v?i cc v?n ?? kh?n c?p ho?c c?p c?u, qu v? c th? lin h? bc s? chuyn khoa tiu ha b?t k? lc no b?ng cch g?i ??n s? [phone].   CH? ?? ?N U?NG: Chng ti [employer] v? tr??c tin nn ?n nh?, nh?ng sau ? qu v? c th? ?n theo ch? ?? bnh th??ng. U?ng nhi?u n??c nh?ng ph?i trnh ?? u?ng c c?n trong 24 gi?.  HO?T ??NG: Qu v? c?n ln k? ho?ch ?? ngh? ng?i trong ngy hm nay v KHNG NN LI XE ho?c s? d?ng my mc n?ng cho ??n ngy mai (do tc  d?ng c?a thu?c an th?n s? d?ng trong th? thu?t).   THEO DI: Nhn vin c?a chng ti s? g?i cho qu v? theo s? ?i?n tho?i trong b?nh n vo ngy lm vi?c ti?p theo sau ngy th?c hi?n th? thu?t ?? ki?m tra tnh tr?ng c?a qu v? v gi?i ?p cc cu h?i ho?c th?c m?c c?a qu v? v? thng tin m qu v? ???c cung c?p sau khi th?c hi?n th? thu?t. N?u chng ti khng lin l?c ???c v?i qu v?, chng ti s? ?? l?i tin nh?n. Tuy nhin, n?u qu v? c?m th?y kh?e v khng g?p b?t k? s? c? no, qu v? khng c?n g?i l?i cho chng ti. Chng ti s? gi? ??nh r?ng qu v? ? tr? l?i sinh ho?t bnh th??ng v khng g?p b?t k? s? c? no.  N?u qu v? ???c l?y sinh thi?t, chng ti s? lin l?c v?i qu v? qua ?i?n tho?i ho?c th? trong 1-3 tu?n ti?p theo. Vui lng g?i cho chng ti theo s? [phone] n?u qu v? khng nh?n ???c thng tin v? k?t qu? sinh thi?t trong 3 tu?n.  CH? K/B?O M?TBETHA Do v? v/ho?c ??i tc ch?m [place] c?a qu v? ? k vo cc ti li?u s? ???c nh?p vo h? s? y t? ?i?n t? c?a qu v?. Ch? k ny xc nh?n r?ng cc thng tin trn ?y trong B?n Tm T?t Sau Khi Th?m Khm c?a qu v? ? ???c xem xt v hi?u r. Qu v? v/ho?c

## 2024-02-23 NOTE — Progress Notes (Signed)
 Sedate, gd SR, tolerated procedure well, VSS, report to RN

## 2024-02-23 NOTE — Op Note (Signed)
 [facility] Patient Name: Morgan Osborne Procedure Date: 02/23/2024 9:08 AM MRN: [id number] Endoscopist: Glendia E. Stacia , MD, [id number] Age: 48 Referring MD:  Date of Birth: 1976/05/02 Gender: Female Account #: [id number] Procedure:                Colonoscopy Indications:              Screening for colorectal malignant neoplasm, This                            is the patient's first colonoscopy Medicines:                Monitored Anesthesia Care Procedure:                Pre-Anesthesia Assessment:                           - Prior to the procedure, a History and Physical                            was performed, and patient medications and                            allergies were reviewed. The patient's tolerance of                            previous anesthesia was also reviewed. The risks                            and benefits of the procedure and the sedation                            options and risks were discussed with the patient.                            All questions were answered, and informed consent                            was obtained. Prior Anticoagulants: The patient has                            taken no anticoagulant or antiplatelet agents. ASA                            Grade Assessment: II - A patient with mild systemic                            disease. After reviewing the risks and benefits,                            the patient was deemed in satisfactory condition to                            undergo the procedure.  After obtaining informed consent, the colonoscope                            was passed under direct vision. Throughout the                            procedure, the patient's blood pressure, pulse, and                            oxygen saturations were monitored continuously. The                            Olympus Scope SN: [id number] was introduced through                            the anus and advanced  to the the terminal ileum,                            with identification of the appendiceal orifice and                            IC valve. The colonoscopy was performed without                            difficulty. The patient tolerated the procedure                            well. The quality of the bowel preparation was                            good. The terminal ileum, ileocecal valve,                            appendiceal orifice, and rectum were photographed.                            The bowel preparation used was SUPREP via split                            dose instruction. Scope In: 9:22:03 AM Scope Out: 9:33:21 AM Scope Withdrawal Time: 0 hours 6 minutes 44 seconds  Total Procedure Duration: 0 hours 11 minutes 18 seconds  Findings:                 The perianal and digital rectal examinations were                            normal. Pertinent negatives include normal                            sphincter tone and no palpable rectal lesions.                           The colon (entire examined portion) appeared normal.  The terminal ileum appeared normal.                           The retroflexed view of the distal rectum and anal                            verge was normal and showed no anal or rectal                            abnormalities. Complications:            No immediate complications. Estimated Blood Loss:     Estimated blood loss: none. Impression:               - The entire examined colon is normal.                           - The examined portion of the ileum was normal.                           - The distal rectum and anal verge are normal on                            retroflexion view.                           - No specimens collected. Recommendation:           - Patient has a contact number available for                            emergencies. The signs and symptoms of potential                            delayed complications  were discussed with the                            patient. Return to normal activities tomorrow.                            Written discharge instructions were provided to the                            patient.                           - Resume previous diet.                           - Continue present medications.                           - Repeat colonoscopy in 10 years for screening                            purposes. Dallie Patton E. Stacia, MD 02/23/2024 9:37:07 AM This report has been signed electronically.

## 2024-02-26 ENCOUNTER — Telehealth: Payer: Self-pay | Admitting: Lactation Services

## 2024-02-26 NOTE — Telephone Encounter (Signed)
 No answer

## 2024-02-26 NOTE — Telephone Encounter (Signed)
 No answer left voice mail

## 2024-03-04 ENCOUNTER — Telehealth: Payer: Self-pay | Admitting: Pharmacist

## 2024-03-04 NOTE — Telephone Encounter (Signed)
 Patient contacted for follow-up of visit planned for 11/5  Patient plans to keep visit with PCP on 11/5  Suggested  A1C, Lipid panel, BMET and UACR at that visit.  Message sent to PCP for delivery 11/3 (prior to visit).   Total time with patient call and documentation of interaction: 8 minutes.

## 2024-03-11 NOTE — Progress Notes (Unsigned)
    SUBJECTIVE:   CHIEF COMPLAINT / HPI:   Discussed the use of AI scribe software for clinical note transcription with the patient, who gave verbal consent to proceed.  *Utilized Vietnamese in-person interpretor for entirety of visit   Diabetes Current Regimen: Lantus  5U daily (in PM), Humalog  4U BID (breakfast  and PM), Metformin  1000 mg twice daily, Jardiance  25 mg daily, Ozempic  2mg  weekly  CBGs: CGM - 65% in range, 2% 54-69, 19 lows in past 90 days, GMI 7.2%, 92% sensor usage.  Last A1c:  Lab Results  Component Value Date   HGBA1C 8.7 (A) 03/13/2024    Denies polyuria, polydipsia. Last Eye Exam: Referred previously Statin: Rosuvastatin  10mg  daily ACE/ARB: Losartan  50mg    Adverse effects of insulin  therapy - Morning administration of Lantus  associated with diarrhea, abdominal cramps, and frequent bowel movements - Nighttime administration of Lantus  avoids these gastrointestinal side effects  Medication access and continuity of care - Preparing to move to [place]  for 3-6 months to support her daughter undergoing liver transplant - Requests medication refills prior to move - Inquires about ability to contact current provider if issues arise while in [place]  - Interested in ensuring medication refills can be obtained at a pharmacy in [place]        PERTINENT  PMH / PSH: HTN, GERD, T2DM, HLD   OBJECTIVE:   BP 109/79   Pulse 73   Ht 5' 1.5 (1.562 m)   Wt 171 lb 6.4 oz (77.7 kg)   SpO2 100%   BMI 31.86 kg/m    General: Alert, no apparent distress, well groomed HEENT: Normocephalic, atraumatic, moist mucus membranes, neck supple Respiratory: Normal respiratory effort GI: Non-distended Skin: No rashes, no jaundice Psych: Appropriate mood and affect  ASSESSMENT/PLAN:    Assessment & Plan Type 2 diabetes mellitus with diabetic microalbuminuria, with long-term current use of insulin  (HCC) A1c elevated at 8.7 with significant blood glucose variability.  Current regimen includes Lantus , Humalog , metformin , Jardiance , and Ozempic . Diarrhea reported with morning Lantus  administration, now taken in the evening. Variability possibly due to stress and meal intake. - Switched Humalog  to sliding scale 0-4 units and provided translated printout with blood glucose ranges and corresponding Humalog  dosages. - Refilled Jardiance , Lantus , Humalog , Metformin , Ozempic , Rosuvastatin  - Advised regular meal times and balanced diet to manage blood glucose variability. - BMP, lipid pane, ACR Essential hypertension Well controlled, refilled current regimen Itching of both hands Intermittent symptoms, refilled triamcinolone  ointment.    Dr. Izetta Nap, DO [facility]

## 2024-03-13 ENCOUNTER — Encounter: Payer: Self-pay | Admitting: Family Medicine

## 2024-03-13 ENCOUNTER — Ambulatory Visit (INDEPENDENT_AMBULATORY_CARE_PROVIDER_SITE_OTHER): Admitting: Family Medicine

## 2024-03-13 VITALS — BP 109/79 | HR 73 | Ht 61.5 in | Wt 171.4 lb

## 2024-03-13 DIAGNOSIS — L299 Pruritus, unspecified: Secondary | ICD-10-CM | POA: Diagnosis not present

## 2024-03-13 DIAGNOSIS — Z794 Long term (current) use of insulin: Secondary | ICD-10-CM

## 2024-03-13 DIAGNOSIS — E1129 Type 2 diabetes mellitus with other diabetic kidney complication: Secondary | ICD-10-CM | POA: Diagnosis not present

## 2024-03-13 DIAGNOSIS — I10 Essential (primary) hypertension: Secondary | ICD-10-CM

## 2024-03-13 DIAGNOSIS — R809 Proteinuria, unspecified: Secondary | ICD-10-CM

## 2024-03-13 LAB — POCT GLYCOSYLATED HEMOGLOBIN (HGB A1C): HbA1c, POC (controlled diabetic range): 8.7 % — AB (ref 0.0–7.0)

## 2024-03-13 MED ORDER — AZELASTINE HCL 0.05 % OP SOLN: 1.0000 [drp] | Freq: Two times a day (BID) | OPHTHALMIC | 0 refills | Status: AC

## 2024-03-13 MED ORDER — INSULIN LISPRO (1 UNIT DIAL) 100 UNIT/ML (KWIKPEN): 4.0000 [IU] | PEN_INJECTOR | Freq: Two times a day (BID) | SUBCUTANEOUS | 3 refills | Status: AC

## 2024-03-13 MED ORDER — TRIAMCINOLONE ACETONIDE 0.025 % EX OINT: TOPICAL_OINTMENT | Freq: Two times a day (BID) | CUTANEOUS | 0 refills | Status: AC

## 2024-03-13 MED ORDER — OZEMPIC (2 MG/DOSE) 8 MG/3ML ~~LOC~~ SOPN: 2.0000 mg | PEN_INJECTOR | SUBCUTANEOUS | 10 refills | Status: AC

## 2024-03-13 MED ORDER — INSULIN GLARGINE 100 UNIT/ML SOLOSTAR PEN: 5.0000 [IU] | PEN_INJECTOR | SUBCUTANEOUS | 11 refills | Status: AC

## 2024-03-13 MED ORDER — LOSARTAN POTASSIUM-HCTZ 50-12.5 MG PO TABS: 1.0000 | ORAL_TABLET | Freq: Every day | ORAL | 3 refills | Status: AC

## 2024-03-13 MED ORDER — METFORMIN HCL 1000 MG PO TABS: 1000.0000 mg | ORAL_TABLET | Freq: Two times a day (BID) | ORAL | 1 refills | Status: AC

## 2024-03-13 MED ORDER — EMPAGLIFLOZIN 25 MG PO TABS: 25.0000 mg | ORAL_TABLET | Freq: Every day | ORAL | 1 refills | Status: AC

## 2024-03-13 MED ORDER — ROSUVASTATIN CALCIUM 10 MG PO TABS: 10.0000 mg | ORAL_TABLET | Freq: Every day | ORAL | 1 refills | Status: AC

## 2024-03-13 NOTE — Patient Instructions (Addendum)
 Th?t tuy?t v?i khi ???c g?p b?n hm nay! C?m ?n b?n ? l?a ch?n [facility].  Vui lng mang theo T?T C? cc lo?i thu?c c?a b?n m?i l?n khm.  Hm nay chng ta ? th?o lu?n v?:  1. Ch? s? A1c c?a b?n l 8,7, l??ng ???ng trong mu c?a b?n dao ??ng r?t nhi?u trong ngy, ?i?u ny c th? kh ki?m sot. Cch t?t nh?t ?? kh?c ph?c ?i?u ny l ?n u?ng ?i?u ?? v ??m b?o b?n ?n u?ng cn b?ng, ??y ?? trong m?i b?a ?n. Ti ?ang chuy?n ??i Humalog  c?a b?n sang thang ?o tr??t ?? hy v?ng ng?n ng?a m?t s? tr??ng h?p h? ???ng huy?t. Vui lng xem t? h??ng d?n ring ?? bi?t thm thng tin v? vi?c ny.  2. Ti ? n?p l?i t?t c? cc lo?i thu?c c?a b?n v?i li?u l??ng t nh?t l 3 thng v h? c th? n?p l?i trong m?t n?m. ?? n?p l?i thu?c t?i [place] , b?n c th? c?n lin h? v?i v?n phng c?a chng ti v cung c?p m?t hi?u thu?c m?i, ti r?t s?n lng g?i thu?c ??n ?. N?u b?n c?n b?t c? th? g trong th?i gian ?i v?ng, vui lng cho ti bi?t.  3. Hm nay chng ti c?ng s? l?y m?t s? k?t qu? xt nghi?m mu th??ng nin c?a b?n. Ti s? theo di k?t qu? trn MyChart v g?i ?i?n cho b?n n?u c?n thay ??i.  Vui lng lin h? l?i khi b?n tr? v? t? [place] . N?u c b?t k? th?c m?c no, vui lng lin h? qua ?i?n tho?i.   It was wonderful to see you today! Thank you for choosing [facility].   Please bring ALL of your medications with you to every visit.   Today we talked about:  Your A1c is 8.7, you have a lot of variability in your blood sugars throughout the day which can be difficult to manage.  The best way to help this would be to have regular mealtimes and ensuring you are eating an adequate balanced diet at each meal.  I am switching your Humalog  to a sliding scale to hopefully prevent some any hypoglycemic events.  Please see the separate sheet for guidance on this. I refilled all of your medications for at least a 26-month supply and they have refills for a year on them.  To have them  refilled in [place]  you may need to reach out to our office and provide a new pharmacy and I be happy to send them there.  If you need anything while you are gone please let me know. We are getting some of your annual blood work today as well, I will follow-up with you regarding those results on MyChart and call if any changes are needed.  Please follow up when back from [place] , please reach out via phone if any concerns   We are checking some labs today. If they are abnormal, I will call you. If they are normal, I will send you a MyChart message (if it is active) or a letter in the mail. If you do not hear about your labs in the next 2 weeks, please call the office.  Call the clinic at [phone] if your symptoms worsen or you have any concerns.  Please be sure to schedule follow up at the front desk before you leave today.   Izetta Nap, DO Family Medicine     Phc ?? ?i?u tr?  Humalog  theo thang tr??t: N?u l??ng ???ng trong mu c?a b?n n?m trong kho?ng sau, vui lng u?ng li?u Humalog  quy ??nh tr??c b?a sng v b?a t?i: - D??i 120, u?ng 0 ??n v? - T? 120-180, u?ng 2 ??n v? - Trn 180, u?ng 4 ??n v? *N?u b?n khng ?n t nh?t m?t n?a b?a ?n, vui lng trnh dng Humalog  v ?i?u ny c th? khi?n l??ng ???ng trong mu c?a b?n xu?ng qu th?p.   Humalog  sliding scale regimen: If your blood sugar is between the following readings please take the specified amount of Humalog  prior to breakfast and dinner: - Below 120, take 0 units - 120-180, take 2 units - Above 180, take 4 units *If you do not eat at least half of your meal please avoid taking the Humalog  dose as this can drop you too low.

## 2024-03-13 NOTE — Assessment & Plan Note (Signed)
 Well controlled, refilled current regimen

## 2024-03-14 ENCOUNTER — Ambulatory Visit (HOSPITAL_BASED_OUTPATIENT_CLINIC_OR_DEPARTMENT_OTHER): Payer: Self-pay | Admitting: Family Medicine

## 2024-03-14 LAB — LIPID PANEL
Chol/HDL Ratio: 3.1 ratio (ref 0.0–4.4)
Cholesterol, Total: 137 mg/dL (ref 100–199)
HDL: 44 mg/dL (ref >39)
LDL Chol Calc (NIH): 67 mg/dL (ref 0–99)
Triglycerides: 148 mg/dL (ref 0–149)
VLDL Cholesterol Cal: 26 mg/dL (ref 5–40)

## 2024-03-14 LAB — BASIC METABOLIC PANEL WITH GFR
BUN/Creatinine Ratio: 16 (ref 9–23)
BUN: 11 mg/dL (ref 6–24)
CO2: 22 mmol/L (ref 20–29)
Calcium: 10 mg/dL (ref 8.7–10.2)
Chloride: 98 mmol/L (ref 96–106)
Creatinine, Ser: 0.67 mg/dL (ref 0.57–1.00)
Glucose: 149 mg/dL — ABNORMAL HIGH (ref 70–99)
Potassium: 4.7 mmol/L (ref 3.5–5.2)
Sodium: 134 mmol/L (ref 134–144)
eGFR: 106 mL/min/1.73 (ref >59)

## 2024-03-14 LAB — MICROALBUMIN / CREATININE URINE RATIO
Creatinine, Urine: 21.7 mg/dL
Microalb/Creat Ratio: 70 mg/g{creat} — ABNORMAL HIGH (ref 0–29)
Microalbumin, Urine: 15.2 ug/mL

## 2024-04-22 ENCOUNTER — Other Ambulatory Visit (HOSPITAL_COMMUNITY): Payer: Self-pay

## 2024-06-13 ENCOUNTER — Other Ambulatory Visit: Payer: Self-pay

## 2024-06-13 MED ORDER — INSULIN PEN NEEDLE 31G X 5 MM MISC: 1 refills | Status: AC

## 2024-06-13 NOTE — Telephone Encounter (Signed)
 Patient calls nurse line requesting a refill on patients pen needles.   She reports the patient is in [place]  and will need to have the prescription transferred to a [facility] near her once sent.   Advised will forward to PCP.   Advised to call [facility] for transfer, as most of her medications have refills.   She was appreciative of help.   Will forward to PCP to send in needles.
